# Patient Record
Sex: Male | Born: 1960 | Race: Black or African American | Hispanic: No | Marital: Single | Smoking: Former smoker
Health system: Southern US, Community
[De-identification: ages and names within clinical notes are randomized; demographics above are authoritative.]

## PROBLEM LIST (undated history)

## (undated) DIAGNOSIS — R7611 Nonspecific reaction to tuberculin skin test without active tuberculosis: Secondary | ICD-10-CM

## (undated) DIAGNOSIS — J4520 Mild intermittent asthma, uncomplicated: Secondary | ICD-10-CM

## (undated) DIAGNOSIS — K219 Gastro-esophageal reflux disease without esophagitis: Secondary | ICD-10-CM

## (undated) DIAGNOSIS — T7840XA Allergy, unspecified, initial encounter: Secondary | ICD-10-CM

## (undated) DIAGNOSIS — R972 Elevated prostate specific antigen [PSA]: Secondary | ICD-10-CM

## (undated) DIAGNOSIS — E7800 Pure hypercholesterolemia, unspecified: Secondary | ICD-10-CM

## (undated) HISTORY — DX: Nonspecific reaction to tuberculin skin test without active tuberculosis: R76.11

## (undated) HISTORY — DX: Mild intermittent asthma, uncomplicated: J45.20

## (undated) HISTORY — DX: Elevated prostate specific antigen (PSA): R97.20

## (undated) HISTORY — DX: Allergy, unspecified, initial encounter: T78.40XA

## (undated) HISTORY — PX: NO PAST SURGERIES: SHX2092

---

## 2004-07-08 ENCOUNTER — Ambulatory Visit (HOSPITAL_COMMUNITY): Admission: RE | Admit: 2004-07-08 | Discharge: 2004-07-08 | Payer: Self-pay | Admitting: *Deleted

## 2005-04-26 ENCOUNTER — Encounter: Admission: RE | Admit: 2005-04-26 | Discharge: 2005-04-26 | Payer: Self-pay | Admitting: Occupational Medicine

## 2010-01-17 ENCOUNTER — Emergency Department (HOSPITAL_COMMUNITY): Admission: EM | Admit: 2010-01-17 | Discharge: 2010-01-18 | Payer: Self-pay | Admitting: Emergency Medicine

## 2010-01-20 ENCOUNTER — Ambulatory Visit: Payer: Self-pay | Admitting: Family Medicine

## 2010-01-20 DIAGNOSIS — E669 Obesity, unspecified: Secondary | ICD-10-CM | POA: Insufficient documentation

## 2010-01-20 DIAGNOSIS — J209 Acute bronchitis, unspecified: Secondary | ICD-10-CM | POA: Insufficient documentation

## 2010-01-20 DIAGNOSIS — J9801 Acute bronchospasm: Secondary | ICD-10-CM

## 2010-01-20 DIAGNOSIS — J309 Allergic rhinitis, unspecified: Secondary | ICD-10-CM | POA: Insufficient documentation

## 2010-01-20 DIAGNOSIS — F17200 Nicotine dependence, unspecified, uncomplicated: Secondary | ICD-10-CM | POA: Insufficient documentation

## 2010-01-20 DIAGNOSIS — R5383 Other fatigue: Secondary | ICD-10-CM

## 2010-01-20 DIAGNOSIS — R05 Cough: Secondary | ICD-10-CM

## 2010-01-20 DIAGNOSIS — R5381 Other malaise: Secondary | ICD-10-CM | POA: Insufficient documentation

## 2010-02-03 ENCOUNTER — Encounter: Payer: Self-pay | Admitting: Physician Assistant

## 2010-02-07 LAB — CONVERTED CEMR LAB
ALT: 19 units/L (ref 0–53)
AST: 17 units/L (ref 0–37)
Albumin: 4.1 g/dL (ref 3.5–5.2)
Alkaline Phosphatase: 102 units/L (ref 39–117)
BUN: 13 mg/dL (ref 6–23)
CO2: 25 meq/L (ref 19–32)
Calcium: 9.5 mg/dL (ref 8.4–10.5)
Chloride: 103 meq/L (ref 96–112)
Cholesterol: 217 mg/dL — ABNORMAL HIGH (ref 0–200)
Creatinine, Ser: 1.01 mg/dL (ref 0.40–1.50)
Glucose, Bld: 94 mg/dL (ref 70–99)
HCT: 50.2 % (ref 39.0–52.0)
HDL: 42 mg/dL (ref >39)
Hemoglobin: 16 g/dL (ref 13.0–17.0)
LDL Cholesterol: 133 mg/dL — ABNORMAL HIGH (ref 0–99)
MCHC: 31.9 g/dL (ref 30.0–36.0)
MCV: 94 fL (ref 78.0–100.0)
PSA: 0.71 ng/mL (ref 0.10–4.00)
Platelets: 195 10*3/uL (ref 150–400)
Potassium: 4.1 meq/L (ref 3.5–5.3)
RBC: 5.34 M/uL (ref 4.22–5.81)
RDW: 14 % (ref 11.5–15.5)
Sodium: 139 meq/L (ref 135–145)
TSH: 0.762 microintl units/mL (ref 0.350–4.500)
Total Bilirubin: 1.3 mg/dL — ABNORMAL HIGH (ref 0.3–1.2)
Total CHOL/HDL Ratio: 5.2
Total Protein: 7.3 g/dL (ref 6.0–8.3)
Triglycerides: 211 mg/dL — ABNORMAL HIGH (ref <150)
VLDL: 42 mg/dL — ABNORMAL HIGH (ref 0–40)
Vit D, 25-Hydroxy: 15 ng/mL — ABNORMAL LOW (ref 30–89)
WBC: 6 10*3/uL (ref 4.0–10.5)

## 2010-02-08 ENCOUNTER — Ambulatory Visit: Payer: Self-pay | Admitting: Family Medicine

## 2010-02-08 DIAGNOSIS — E559 Vitamin D deficiency, unspecified: Secondary | ICD-10-CM | POA: Insufficient documentation

## 2010-02-08 DIAGNOSIS — E785 Hyperlipidemia, unspecified: Secondary | ICD-10-CM | POA: Insufficient documentation

## 2010-02-08 DIAGNOSIS — K219 Gastro-esophageal reflux disease without esophagitis: Secondary | ICD-10-CM

## 2010-02-08 LAB — CONVERTED CEMR LAB: OCCULT 1: NEGATIVE

## 2010-04-11 ENCOUNTER — Telehealth: Payer: Self-pay | Admitting: Physician Assistant

## 2010-04-18 ENCOUNTER — Ambulatory Visit: Payer: Self-pay | Admitting: Family Medicine

## 2010-05-11 ENCOUNTER — Telehealth: Payer: Self-pay | Admitting: Physician Assistant

## 2010-05-12 ENCOUNTER — Ambulatory Visit: Payer: Self-pay | Admitting: Family Medicine

## 2010-05-12 DIAGNOSIS — J019 Acute sinusitis, unspecified: Secondary | ICD-10-CM

## 2010-06-08 LAB — CONVERTED CEMR LAB
Cholesterol: 224 mg/dL — ABNORMAL HIGH (ref 0–200)
HDL: 34 mg/dL — ABNORMAL LOW (ref >39)
LDL Cholesterol: 164 mg/dL — ABNORMAL HIGH (ref 0–99)
Total CHOL/HDL Ratio: 6.6
Triglycerides: 131 mg/dL (ref <150)
VLDL: 26 mg/dL (ref 0–40)
Vit D, 25-Hydroxy: 29 ng/mL — ABNORMAL LOW (ref 30–89)

## 2010-06-09 ENCOUNTER — Ambulatory Visit: Payer: Self-pay | Admitting: Family Medicine

## 2010-06-09 ENCOUNTER — Telehealth: Payer: Self-pay | Admitting: Physician Assistant

## 2010-06-10 ENCOUNTER — Telehealth: Payer: Self-pay | Admitting: Physician Assistant

## 2010-06-22 ENCOUNTER — Ambulatory Visit (HOSPITAL_COMMUNITY): Admission: RE | Admit: 2010-06-22 | Discharge: 2010-06-22 | Payer: Self-pay | Admitting: Family Medicine

## 2010-06-22 ENCOUNTER — Encounter: Payer: Self-pay | Admitting: Physician Assistant

## 2010-07-11 ENCOUNTER — Ambulatory Visit: Payer: Self-pay | Admitting: Family Medicine

## 2010-07-11 DIAGNOSIS — J449 Chronic obstructive pulmonary disease, unspecified: Secondary | ICD-10-CM

## 2010-07-11 DIAGNOSIS — J4489 Other specified chronic obstructive pulmonary disease: Secondary | ICD-10-CM | POA: Insufficient documentation

## 2010-07-14 ENCOUNTER — Encounter: Payer: Self-pay | Admitting: Family Medicine

## 2010-09-02 LAB — CONVERTED CEMR LAB
ALT: 21 units/L (ref 0–53)
AST: 18 units/L (ref 0–37)
Albumin: 4.2 g/dL (ref 3.5–5.2)
Alkaline Phosphatase: 80 units/L (ref 39–117)
BUN: 12 mg/dL (ref 6–23)
CO2: 26 meq/L (ref 19–32)
Calcium: 9.1 mg/dL (ref 8.4–10.5)
Chloride: 103 meq/L (ref 96–112)
Cholesterol: 204 mg/dL — ABNORMAL HIGH (ref 0–200)
Creatinine, Ser: 1.02 mg/dL (ref 0.40–1.50)
Glucose, Bld: 97 mg/dL (ref 70–99)
HDL: 36 mg/dL — ABNORMAL LOW (ref >39)
LDL Cholesterol: 137 mg/dL — ABNORMAL HIGH (ref 0–99)
Potassium: 4.1 meq/L (ref 3.5–5.3)
Sodium: 139 meq/L (ref 135–145)
Total Bilirubin: 1 mg/dL (ref 0.3–1.2)
Total CHOL/HDL Ratio: 5.7
Total Protein: 7.8 g/dL (ref 6.0–8.3)
Triglycerides: 155 mg/dL — ABNORMAL HIGH (ref <150)
VLDL: 31 mg/dL (ref 0–40)

## 2010-09-06 ENCOUNTER — Ambulatory Visit: Payer: Self-pay | Admitting: Family Medicine

## 2010-10-03 ENCOUNTER — Emergency Department (HOSPITAL_BASED_OUTPATIENT_CLINIC_OR_DEPARTMENT_OTHER)
Admission: EM | Admit: 2010-10-03 | Discharge: 2010-10-03 | Payer: Self-pay | Source: Home / Self Care | Admitting: Emergency Medicine

## 2010-10-03 ENCOUNTER — Encounter: Payer: Self-pay | Admitting: Family Medicine

## 2010-10-03 ENCOUNTER — Telehealth: Payer: Self-pay | Admitting: Family Medicine

## 2010-10-04 ENCOUNTER — Ambulatory Visit
Admission: RE | Admit: 2010-10-04 | Discharge: 2010-10-04 | Payer: Self-pay | Source: Home / Self Care | Attending: Family Medicine | Admitting: Family Medicine

## 2010-10-05 ENCOUNTER — Encounter: Payer: Self-pay | Admitting: Family Medicine

## 2010-10-10 ENCOUNTER — Ambulatory Visit
Admission: RE | Admit: 2010-10-10 | Discharge: 2010-10-10 | Payer: Self-pay | Source: Home / Self Care | Attending: Internal Medicine | Admitting: Internal Medicine

## 2010-10-25 NOTE — Assessment & Plan Note (Signed)
Summary: OV   Vital Signs:  Patient profile:   50 year old male Height:      72 inches Weight:      236.75 pounds O2 Sat:      95 % on Room air Pulse rate:   100 / minute Resp:     16 per minute BP sitting:   110 / 80  (left arm)  Vitals Entered By: Mauricia Area CMA (July 11, 2010 3:39 PM) CC: congestion. Producing yellow phlegm   Primary Provider:  Esperanza Sheets PA  CC:  congestion. Producing yellow phlegm.  History of Present Illness: Pt continues to have episodic cough and congestion.  Returned again approx 3 days ago.  Cough is sometimes productive with yellowish phlegm.  Is using Advair two times a day.  Also has nasal congestion, sneezing and some post nasal drainage.  No fever or chills.  Pt has been cutting back on smoking.  Wants to quit. Reviewed recent PFT results.    Allergies (verified): 1)  ! * Pcn  Past History:  Past medical history reviewed for relevance to current acute and chronic problems, and updated.  Past Medical History: Hyperlipidemia Vitamin D Deficiency COPD  Review of Systems General:  Denies chills and fever. ENT:  Complains of nasal congestion and postnasal drainage; denies earache, sinus pressure, and sore throat. CV:  Denies chest pain or discomfort and palpitations. Resp:  Complains of cough and sputum productive; denies wheezing. Allergy:  Complains of seasonal allergies and sneezing.  Physical Exam  General:  Well-developed,well-nourished,in no acute distress; alert,appropriate and cooperative throughout examination Head:  Normocephalic and atraumatic without obvious abnormalities. No apparent alopecia or balding. Ears:  External ear exam shows no significant lesions or deformities.  Otoscopic examination reveals clear canals, tympanic membranes are intact bilaterally without bulging, retraction, inflammation or discharge. Hearing is grossly normal bilaterally. Nose:  External nasal examination shows no deformity or  inflammation. Nasal mucosa are pink and moist without lesions or exudates. Mouth:  Oral mucosa and oropharynx without lesions or exudates.  Teeth in good repair. Neck:  No deformities, masses, or tenderness noted. Lungs:  Normal respiratory effort, chest expands symmetrically. Lungs are clear to auscultation, no crackles or wheezes. Heart:  Normal rate and regular rhythm. S1 and S2 normal without gallop, murmur, click, rub or other extra sounds. Cervical Nodes:  No lymphadenopathy noted Psych:  Cognition and judgment appear intact. Alert and cooperative with normal attention span and concentration. No apparent delusions, illusions, hallucinations   Impression & Recommendations:  Problem # 1:  ALLERGIC RHINITIS (ICD-477.9) Assessment Comment Only  Orders: Allergy Referral  (Allergy)  Problem # 2:  COPD, MILD (ICD-496) Assessment: Comment Only  His updated medication list for this problem includes:    Ventolin Hfa 108 (90 Base) Mcg/act Aers (Albuterol sulfate) .Marland Kitchen... 1 puff two times a day    Advair Diskus 250-50 Mcg/dose Aepb (Fluticasone-salmeterol) ..... One inhalation two times a day.  Complete Medication List: 1)  Ventolin Hfa 108 (90 Base) Mcg/act Aers (Albuterol sulfate) .Marland Kitchen.. 1 puff two times a day 2)  Advair Diskus 250-50 Mcg/dose Aepb (Fluticasone-salmeterol) .... One inhalation two times a day. 3)  Vitamin D 1000 Unit Tabs (Cholecalciferol) .... Take 1 daily 4)  Omeprazole 20 Mg Cpdr (Omeprazole) .... Take 1 daily 5)  Tessalon Perles 100 Mg Caps (Benzonatate) .... Take 1 every 8 hrs as needed for cough 6)  Aspirin 81 Mg Tbec (Aspirin) .... Take 1 daily 7)  Fish Oil 1000 Mg Caps (  Omega-3 fatty acids) .... Take 1 daily 8)  Pravastatin Sodium 20 Mg Tabs (Pravastatin sodium) .... Take 1 tab daily at bedtime 9)  Zyrtec-d Allergy & Congestion 5-120 Mg Xr12h-tab (Cetirizine-pseudoephedrine) .... One two times a day 10)  Levaquin 750 Mg Tabs (Levofloxacin) .... Take one daily x 7  days  Patient Instructions: 1)  Follow up as planned. 2)  I have prescribed an antibiotic for you. 3)  I have referred you to an allergist for evaluation. 4)  Continue using your inhalers as prescribed. 5)  Tobacco is very bad for your health and your loved ones! You Should stop smoking!. 6)  Stop Smoking Tips: Choose a Quit date. Cut down before the Quit date. decide what you will do as a substitute when you feel the urge to smoke(gum,toothpick,exercise). 7)  Your insurance does not cover prescription products for smoking cessation.  I recommend you try an over the counter product such as the nicotine 7 mg patch. Prescriptions: LEVAQUIN 750 MG TABS (LEVOFLOXACIN) take one daily x 7 days  #7 x 0   Entered and Authorized by:   Esperanza Sheets PA   Signed by:   Esperanza Sheets PA on 07/11/2010   Method used:   [facility]        [facility] [phone][facility] (retail)       [address], [place]  [address]       Ph: [phone] or [phone]       Fax: [phone]   RxID:   [id number]    Orders Added: 1)  Allergy Referral  [Allergy] 2)  Est. Patient Level III [34742]

## 2010-10-25 NOTE — Assessment & Plan Note (Signed)
Summary: physical- room 2   Vital Signs:  Patient profile:   50 year old male Height:      72 inches Weight:      236 pounds BMI:     32.12 O2 Sat:      98 % on Room air Pulse rate:   119 / minute Resp:     16 per minute BP sitting:   130 / 80  (left arm)  Vitals Entered By: Adella Hare LPN (Feb 08, 2010 4:05 PM) CC: physical Is Patient Diabetic? No Pain Assessment Patient in pain? no      Comments did not bring meds to ov   Primary Provider:  Esperanza Sheets PA  CC:  physical.  History of Present Illness: Pt is here today for a physcial.  No current complaints or concerns.  He was recently treated for an asthmatic bronchitis.  States his breathing is much better.  cough has resolved.  Did use albuterol mdi twice last week due to wheeze but none this week.  He is still using Advair two times a day.  Just picked up a refill on this.  Last tetnus 02-04-10 (Td) No prev eye exam - has vision screening at work yrly. Hearing& breathing test at work yrly - tomorrow Dental care twice a yr.  Again discussed recent lab test results.    Allergies (verified): 1)  ! * Pcn  Past History:  Past medical, surgical, family and social histories (including risk factors) reviewed, and no changes noted (except as noted below).  Past Medical History: Hyperlipidemia Vitamin D Deficiency  Past Surgical History: Reviewed history from 01/20/2010 and no changes required. Denies surgical history  Family History: Reviewed history from 01/20/2010 and no changes required. mother-living-htn father-deceased-diabetic 3 sisters-healthy 2 brothers-healthy  Social History: Reviewed history from 01/20/2010 and no changes required. 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 Eyes:  Denies blurring and double vision. ENT:  Denies decreased  hearing, difficulty swallowing, ear discharge, earache, nasal congestion, postnasal drainage, and sore throat. CV:  Complains of chest pain or discomfort; denies palpitations; Lt rib tender to touch or if lies on Lt side. Resp:  Complains of wheezing; denies cough and shortness of breath. GI:  Complains of indigestion; denies abdominal pain, bloody stools, change in bowel habits, constipation, dark tarry stools, diarrhea, nausea, and vomiting. GU:  Denies decreased libido, discharge, dysuria, erectile dysfunction, incontinence, nocturia, and urinary frequency. MS:  Denies joint pain, low back pain, and mid back pain. Derm:  Denies lesion(s) and rash. Neuro:  Denies headaches, numbness, and tingling. Allergy:  Complains of seasonal allergies and sneezing.  Physical Exam  General:  Well-developed,well-nourished,in no acute distress; alert,appropriate and cooperative throughout examination Head:  Normocephalic and atraumatic without obvious abnormalities. No apparent alopecia or balding. Eyes:  No corneal or conjunctival inflammation noted. EOMI. Perrla. Funduscopic exam benign, without hemorrhages, exudates or papilledema. Vision grossly normal. Ears:  External ear exam shows no significant lesions or deformities.  Otoscopic examination reveals clear canals, tympanic membranes are intact bilaterally without bulging, retraction, inflammation or discharge. Hearing is grossly normal bilaterally. Nose:  External nasal examination shows no deformity or inflammation. Nasal mucosa are pink and moist without lesions or exudates. Mouth:  Oral mucosa and oropharynx without lesions or exudates.  Teeth in good repair. Neck:  No deformities, masses, or tenderness noted.no thyromegaly and no thyroid nodules or tenderness.  Chest Wall:  no deformities and no masses.   Lungs:  Normal respiratory effort, chest expands symmetrically. Lungs are clear to auscultation, no crackles or wheezes. Heart:  Normal rate and  regular rhythm. S1 and S2 normal without gallop, murmur, click, rub or other extra sounds. Abdomen:  Bowel sounds positive,abdomen soft and non-tender without masses, organomegaly or hernias noted. Rectal:  No external abnormalities noted. Normal sphincter tone. No rectal masses or tenderness. Genitalia:  Testes bilaterally descended without nodularity, tenderness or masses. No scrotal masses or lesions. No penis lesions or urethral discharge. Pulses:  R posterior tibial normal, R dorsalis pedis normal, L posterior tibial normal, and L dorsalis pedis normal.   Extremities:  No clubbing, cyanosis, edema, or deformity noted with normal full range of motion of all joints.   Neurologic:  alert & oriented X3, cranial nerves II-XII intact, strength normal in all extremities, sensation intact to light touch, gait normal, and DTRs symmetrical and normal.   Skin:  Intact without suspicious lesions or rashes Cervical Nodes:  No lymphadenopathy noted Psych:  Cognition and judgment appear intact. Alert and cooperative with normal attention span and concentration. No apparent delusions, illusions, hallucinations   Impression & Recommendations:  Problem # 1:  Preventive Health Care (ICD-V70.0) Assessment Comment Only Encouraged routine optometry exam. Pt receives vaccines thru employer, including flu.  Problem # 2:  ESOPHAGEAL REFLUX (ICD-530.81) Assessment: New  His updated medication list for this problem includes:    Omeprazole 20 Mg Cpdr (Omeprazole) .Marland Kitchen... Take 1 daily  Orders: Hemoccult Guaiac-1 spec.(in office) (82270)  Problem # 3:  HYPERLIPIDEMIA (ICD-272.4) Assessment: Comment Only  Orders: T-Lipid Profile [id number])  Labs Reviewed: SGOT: 17 (02/03/2010)   SGPT: 19 (02/03/2010)   HDL:42 (02/03/2010)  LDL:133 (02/03/2010)  Chol:217 (02/03/2010)  Trig:211 (02/03/2010)  Problem # 4:  VITAMIN D DEFICIENCY (ICD-268.9) Assessment: Comment Only  Orders: T-Vitamin D (25-Hydroxy)  ([id number])  Problem # 5:  ACUTE BRONCHITIS (ICD-466.0) Assessment: Improved Pt to will continue Advair x approx 2 more weeks then discontinue. He is having a pulmonary function test at work tomorrow.  Will notify the office if abn.   The following medications were removed from the medication list:    Allegra-d 24 Hour 180-240 Mg Xr24h-tab (Fexofenadine-pseudoephedrine) .Marland Kitchen... Take 1 daily    Tessalon Perles 100 Mg Caps (Benzonatate) .Marland Kitchen... Take 1-2 every 8 hrs as needed for cough His updated medication list for this problem includes:    Ventolin Hfa 108 (90 Base) Mcg/act Aers (Albuterol sulfate) .Marland Kitchen... 1 puff two times a day    Advair Diskus 250-50 Mcg/dose Aepb (Fluticasone-salmeterol) ..... One inhalation bid  Complete Medication List: 1)  Ventolin Hfa 108 (90 Base) Mcg/act Aers (Albuterol sulfate) .Marland Kitchen.. 1 puff two times a day 2)  Advair Diskus 250-50 Mcg/dose Aepb (Fluticasone-salmeterol) .... One inhalation bid 3)  Vitamin D (ergocalciferol) 50000 Unit Caps (Ergocalciferol) .... One cap by mouth every week 4)  Omeprazole 20 Mg Cpdr (Omeprazole) .... Take 1 daily  Preventive Care Screening  Hemoccult:    Date:  02/08/2010    Results:  negative  Last Tetanus Booster:    Date:  02/04/2010    Results:  Historical    Patient Instructions: 1)  Please schedule a follow-up appointment in 4 months. 2)  It is important that you exercise regularly at least 20 minutes 5 times a week. If you develop chest pain, have severe difficulty breathing, or feel very tired , stop exercising immediately and seek medical attention. 3)  You  need to lose weight. Consider a lower calorie diet and regular exercise.  4)  I have ordered your blood work to have drawn fasting in 3 months. 5)  Follow a low fat diet to improve your cholesterol levels. Prescriptions: OMEPRAZOLE 20 MG CPDR (OMEPRAZOLE) take 1 daily  #30 x 0   Entered and Authorized by:   Esperanza Sheets PA   Signed by:   Esperanza Sheets PA on  02/08/2010   Method used:   [facility]        [facility] [phone]* (retail)       [address], [place]  [address]       Ph: [phone] or [phone]       Fax: [phone]   RxID:   [id number]    Immunization History:  Tetanus/Td Immunization History:    Tetanus/Td:  td (02/04/2010)    Tetanus/Td:  historical (02/04/2010)  Laboratory Results    Stool - Occult Blood Hemmoccult #1: negative Date: 02/08/2010    Preventive Care Screening  Hemoccult:    Date:  02/08/2010    Results:  negative  Last Tetanus Booster:    Date:  02/04/2010    Results:  Historical    Prevention & Chronic Care Immunizations   Influenza vaccine: Not documented   Influenza vaccine due: 05/26/2010    Tetanus booster: 02/04/2010: Td   Tetanus booster due: 02/05/2020    Pneumococcal vaccine: Not documented  Other Screening   PSA: 0.71  (02/03/2010)   Smoking status: current  (01/20/2010)   Smoking cessation counseling: yes  (01/20/2010)  Lipids   Total Cholesterol: 217  (02/03/2010)   LDL: 133  (02/03/2010)   LDL Direct: Not documented   HDL: 42  (02/03/2010)   Triglycerides: 211  (02/03/2010)    SGOT (AST): 17  (02/03/2010)   SGPT (ALT): 19  (02/03/2010)   Alkaline phosphatase: 102  (02/03/2010)   Total bilirubin: 1.3  (02/03/2010)  Self-Management Support :    Lipid self-management support: Not documented    Appended Document: physical- room 2 Prostate exam neg.  Firm, smooth without mass/nodule.  Nontender.

## 2010-10-25 NOTE — Progress Notes (Signed)
  Phone Note Call from Patient   Summary of Call: Patient called in wanting to see Timothy Chung and was in severe pain from what he thought was a pinched nerve. I advised urgent care or ER Initial call taken by: Everitt Amber LPN,  April 11, 2010 8:18 AM

## 2010-10-25 NOTE — Assessment & Plan Note (Signed)
Summary: office visit ROOM 1   Vital Signs:  Patient profile:   50 year old male Height:      72 inches Weight:      235 pounds BMI:     31.99 O2 Sat:      97 % Pulse rate:   106 / minute Resp:     16 per minute BP sitting:   120 / 82  (left arm) Cuff size:   large  Vitals Entered By: Everitt Amber LPN (June 09, 2010 10:33 AM)  Nutrition Counseling: Patient's BMI is greater than 25 and therefore counseled on weight management options. CC: Follow up visit  Comments didn't bring meds   Primary Provider:  Esperanza Sheets PA  CC:  Follow up visit .  History of Present Illness: Pt presents today for check up and discuss recent labs.  He states he is feeling better.  Cough has improved and not wheezing. He is still using Advair two times a day.  He is using over the counter Zytec for allergies.  States he went fishing a couple days ago and afterwards had sneezing, increased nasal congestion and cough.  Pt has improved diet and taking fish oil caps for cholesterol.  No known FH of Hyperlipidemia or CAD.  Hx of GERD.  Doing well on Omeprazole.  No current indigestion problems.    Current Medications (verified): 1)  Ventolin Hfa 108 (90 Base) Mcg/act Aers (Albuterol Sulfate) .Marland Kitchen.. 1 Puff Two Times A Day 2)  Advair Diskus 250-50 Mcg/dose Aepb (Fluticasone-Salmeterol) .... One Inhalation Bid 3)  Vitamin D (Ergocalciferol) 50000 Unit Caps (Ergocalciferol) .... One Cap By Mouth Every Week 4)  Omeprazole 20 Mg Cpdr (Omeprazole) .... Take 1 Daily 5)  Tessalon Perles 100 Mg Caps (Benzonatate) .... Take 1 Every 8 Hrs As Needed For Cough  Allergies (verified): 1)  ! * Pcn  Past History:  Past medical, surgical, family and social histories (including risk factors) reviewed for relevance to current acute and chronic problems.  Past Medical History: Reviewed history from 02/08/2010 and no changes required. Hyperlipidemia Vitamin D Deficiency  Past Surgical History: Reviewed history  from 01/20/2010 and no changes required. Denies surgical history  Family History: Reviewed history from 01/20/2010 and no changes required. mother-living-htn father-deceased-diabetic 3 sisters-healthy 2 brothers-healthy  Social History: Reviewed history from 01/20/2010 and no changes required. 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 ENT:  Complains of nasal congestion; denies earache, sinus pressure, and sore throat. CV:  Denies chest pain or discomfort. Resp:  Denies cough, shortness of breath, and wheezing.  Physical Exam  General:  Well-developed,well-nourished,in no acute distress; alert,appropriate and cooperative throughout examination Head:  Normocephalic and atraumatic without obvious abnormalities. No apparent alopecia or balding. Ears:  External ear exam shows no significant lesions or deformities.  Otoscopic examination reveals clear canals, tympanic membranes are intact bilaterally without bulging, retraction, inflammation or discharge. Hearing is grossly normal bilaterally. Nose:  no external deformity.  Nasal turbs mod swollen with clear mucus Mouth:  Oral mucosa and oropharynx without lesions or exudates.  Teeth in good repair. Neck:  No deformities, masses, or tenderness noted. Lungs:  Normal respiratory effort, chest expands symmetrically. Lungs are clear to auscultation, no crackles or wheezes. Heart:  Normal rate and regular rhythm. S1 and S2 normal without gallop, murmur, click, rub or other extra sounds. Cervical Nodes:  No lymphadenopathy noted Psych:  Cognition and judgment  appear intact. Alert and cooperative with normal attention span and concentration. No apparent delusions, illusions, hallucinations   Impression & Recommendations:  Problem # 1:  HYPERLIPIDEMIA (ICD-272.4) Assessment Deteriorated  Chol diet handout  given.  His updated medication list for this problem includes:    Crestor 5 Mg Tabs (Rosuvastatin calcium) .Marland Kitchen... Take 1 daily for cholesterol  Orders: T-Comprehensive Metabolic Panel [id number]) T-Lipid Profile ([id number])  Problem # 2:  ALLERGIC RHINITIS (ICD-477.9) Assessment: Comment Only Continue Zyrtec.  Problem # 3:  ESOPHAGEAL REFLUX (ICD-530.81) Assessment: Improved  His updated medication list for this problem includes:    Omeprazole 20 Mg Cpdr (Omeprazole) .Marland Kitchen... Take 1 daily  Problem # 4:  ACUTE BRONCHOSPASM (ICD-519.11) Assessment: Comment Only  Orders: PFT Baseline-Pre/Post Bronchodiolator (PFT Baseline-Pre/Pos)  Complete Medication List: 1)  Ventolin Hfa 108 (90 Base) Mcg/act Aers (Albuterol sulfate) .Marland Kitchen.. 1 puff two times a day 2)  Advair Diskus 250-50 Mcg/dose Aepb (Fluticasone-salmeterol) .... One inhalation bid 3)  Vitamin D 1000 Unit Tabs (Cholecalciferol) .... Take 1 daily 4)  Omeprazole 20 Mg Cpdr (Omeprazole) .... Take 1 daily 5)  Tessalon Perles 100 Mg Caps (Benzonatate) .... Take 1 every 8 hrs as needed for cough 6)  Aspirin 81 Mg Tbec (Aspirin) .... Take 1 daily 7)  Fish Oil 1000 Mg Caps (Omega-3 fatty acids) .... Take 1 daily 8)  Crestor 5 Mg Tabs (Rosuvastatin calcium) .... Take 1 daily for cholesterol  Patient Instructions: 1)  Please schedule a follow-up appointment in 3 months. 2)  It is important that you exercise regularly at least 20 minutes 5 times a week. If you develop chest pain, have severe difficulty breathing, or feel very tired , stop exercising immediately and seek medical attention. 3)  You need to lose weight. Consider a lower calorie diet and regular exercise.  4)  Start Crestor for cholesterol. 5)  I have ordered a breathing test for you. Prescriptions: CRESTOR 5 MG TABS (ROSUVASTATIN CALCIUM) take 1 daily for cholesterol  #30 x 3   Entered and Authorized by:   Esperanza Sheets PA   Signed by:   Esperanza Sheets PA on 06/09/2010    Method used:   [facility]        [facility]. [phone]* (retail)       [address], [place]  [address]       Ph: [phone] or [phone]       Fax: [phone]   RxID:   [id number] ADVAIR DISKUS 250-50 MCG/DOSE AEPB (FLUTICASONE-SALMETEROL) one inhalation bid  #1 x 2   Entered and Authorized by:   Esperanza Sheets PA   Signed by:   Esperanza Sheets PA on 06/09/2010   Method used:   [facility] to        [facility]. [phone]* (retail)       [address] [place]  [address]       Ph: [phone] or [phone]       Fax: [phone]   RxID:   [id number] VENTOLIN HFA 108 (90 BASE) MCG/ACT AERS (ALBUTEROL SULFATE) 1 puff two times a day  #1 x 2   Entered and Authorized by:   Esperanza Sheets PA   Signed by:   Esperanza Sheets PA on 06/09/2010   Method used:   [facility] to        [facility]. [phone]* (retail)  [address]       [place], [place]  [address]       Ph: [phone] or [phone]       Fax: [phone]   RxID:   [id number]

## 2010-10-25 NOTE — Assessment & Plan Note (Signed)
Summary: ov Room 1   Vital Signs:  Patient profile:   50 year old male Height:      72 inches Weight:      233 pounds BMI:     31.71 O2 Sat:      95 % Pulse rate:   110 / minute Resp:     16 per minute BP sitting:   120 / 80  (left arm) Cuff size:   large  Vitals Entered By: Everitt Amber LPN CC: was here last time for congestion and the meds given didn't work. Still coughing up yellowish phlegm and nasal congestion   Primary Provider:  Esperanza Sheets PA  CC:  was here last time for congestion and the meds given didn't work. Still coughing up yellowish phlegm and nasal congestion.  History of Present Illness: Pt presents today with complaints of continued congestion and cough. States he is coughing up and blowing thick yellow mucus from his nose.  Is wheezing at night.  No fever or chills. Is having some Lt ear discomfort too.  No improvement at all with last antibiotic prescription.  Using Adviar two times a day.  Using albuterol once a night avg when awakens with wheezing.  Denies difficulty breathing or wheezing during the daytime.  He has run out of Tessalon, and this was helping with his cough.      Current Medications (verified): 1)  Ventolin Hfa 108 (90 Base) Mcg/act Aers (Albuterol Sulfate) .Marland Kitchen.. 1 Puff Two Times A Day 2)  Advair Diskus 250-50 Mcg/dose Aepb (Fluticasone-Salmeterol) .... One Inhalation Bid 3)  Vitamin D (Ergocalciferol) 50000 Unit Caps (Ergocalciferol) .... One Cap By Mouth Every Week 4)  Omeprazole 20 Mg Cpdr (Omeprazole) .... Take 1 Daily  Allergies (verified): 1)  ! * Pcn  Past History:  Past medical history reviewed for relevance to current acute and chronic problems.  Past Medical History: Reviewed history from 02/08/2010 and no changes required. Hyperlipidemia Vitamin D Deficiency  Review of Systems General:  Denies chills and fever. ENT:  Complains of earache, nasal congestion, and postnasal drainage; denies sinus pressure and sore  throat. CV:  Denies chest pain or discomfort. Resp:  Complains of cough, sputum productive, and wheezing; denies shortness of breath. Allergy:  Denies itching eyes and sneezing.  Physical Exam  General:  Well-developed,well-nourished,in no acute distress; alert,appropriate and cooperative throughout examination Head:  Normocephalic and atraumatic without obvious abnormalities. No apparent alopecia or balding. Ears:  External ear exam shows no significant lesions or deformities.  Otoscopic examination reveals clear canals, tympanic membranes are intact bilaterally without bulging, retraction, inflammation or discharge. Hearing is grossly normal bilaterally. Nose:  no external deformity.  Mild edema of nasal turbs with erythema and yellowish mucus bilat Mouth:  Oral mucosa and oropharynx without lesions or exudates.  Teeth in good repair. Neck:  No deformities, masses, or tenderness noted. Lungs:  normal respiratory effort, no intercostal retractions, and no accessory muscle use.  Mild rhonchi bilat bases, no rales or wheeze. Heart:  Normal rate and regular rhythm. S1 and S2 normal without gallop, murmur, click, rub or other extra sounds. Cervical Nodes:  No lymphadenopathy noted Psych:  Cognition and judgment appear intact. Alert and cooperative with normal attention span and concentration. No apparent delusions, illusions, hallucinations   Impression & Recommendations:  Problem # 1:  ACUTE BRONCHITIS (ICD-466.0) Assessment Unchanged Oredered CXR, but then pt stated that he had one in April at Urgent care when he had same syptoms. Consider ordering PFT at  f/u appt.  His updated medication list for this problem includes:    Ventolin Hfa 108 (90 Base) Mcg/act Aers (Albuterol sulfate) .Marland Kitchen... 1 puff two times a day    Advair Diskus 250-50 Mcg/dose Aepb (Fluticasone-salmeterol) ..... One inhalation bid    Avelox 400 Mg Tabs (Moxifloxacin hcl) .Marland Kitchen... Take 1 daily x 7 days    Tessalon Perles 100  Mg Caps (Benzonatate) .Marland Kitchen... Take 1 every 8 hrs as needed for cough  Problem # 2:  SINUSITIS, ACUTE (ICD-461.9) Assessment: New  His updated medication list for this problem includes:    Avelox 400 Mg Tabs (Moxifloxacin hcl) .Marland Kitchen... Take 1 daily x 7 days    Tessalon Perles 100 Mg Caps (Benzonatate) .Marland Kitchen... Take 1 every 8 hrs as needed for cough  Complete Medication List: 1)  Ventolin Hfa 108 (90 Base) Mcg/act Aers (Albuterol sulfate) .Marland Kitchen.. 1 puff two times a day 2)  Advair Diskus 250-50 Mcg/dose Aepb (Fluticasone-salmeterol) .... One inhalation bid 3)  Vitamin D (ergocalciferol) 50000 Unit Caps (Ergocalciferol) .... One cap by mouth every week 4)  Omeprazole 20 Mg Cpdr (Omeprazole) .... Take 1 daily 5)  Prednisone 20 Mg Tabs (Prednisone) .... Take 2 tabs daily x 4 days 6)  Avelox 400 Mg Tabs (Moxifloxacin hcl) .... Take 1 daily x 7 days 7)  Tessalon Perles 100 Mg Caps (Benzonatate) .... Take 1 every 8 hrs as needed for cough  Other Orders: Chest - 2 Views (Chest 2 Views)  Patient Instructions: 1)  Please schedule a follow-up appointment in 2 weeks, sooner if needed. 2)  I have prescribed Prednisone and Avelox for you. 3)  I have refilled your cough medicine also. Prescriptions: TESSALON PERLES 100 MG CAPS (BENZONATATE) take 1 every 8 hrs as needed for cough  #30 x 0   Entered and Authorized by:   Esperanza Sheets PA   Signed by:   Esperanza Sheets PA on 05/12/2010   Method used:   [facility]        [facility] [phone]* (retail)       [address], [place]  [address]       Ph: [phone] or [phone]       Fax: [phone]   RxID:   [id number] AVELOX 400 MG TABS (MOXIFLOXACIN HCL) take 1 daily x 7 days  #7 x 0   Entered and Authorized by:   Esperanza Sheets PA   Signed by:   Esperanza Sheets PA on 05/12/2010   Method used:   [facility] to        [facility] [phone]* (retail)       [address] [place]  [address]       Ph: [phone] or  [phone]       Fax: [phone]   RxID:   [id number] PREDNISONE 20 MG TABS (PREDNISONE) take 2 tabs daily x 4 days  #8 x 0   Entered and Authorized by:   Esperanza Sheets PA   Signed by:   Esperanza Sheets PA on 05/12/2010   Method used:   [facility] to        [facility] [phone]* (retail)       [address]       [place], [place]  [address]       Ph: [phone] or [phone]       Fax: [phone]  RxID:   [id number]

## 2010-10-25 NOTE — Progress Notes (Signed)
Summary: FORGOT TO TEEL YOU  Phone Note Call from Patient   Summary of Call: NEEDS TESSALON PERLES [facility] AT [facility] [address] [facility] [place] Initial call taken by: Lind Guest,  June 09, 2010 10:59 AM    Prescriptions: TESSALON PERLES 100 MG CAPS (BENZONATATE) take 1 every 8 hrs as needed for cough  #30 x 0   Entered and Authorized by:   Esperanza Sheets PA   Signed by:   Esperanza Sheets PA on 06/09/2010   Method used:   [facility] to        [facility] [phone]* (retail)       [address]       [place], [place]  [address]       Ph: [phone] or [phone]       Fax: [phone]   RxID:   [id number]

## 2010-10-25 NOTE — Assessment & Plan Note (Signed)
Summary: new patient ROOM-3   Vital Signs:  Patient profile:   50 year old male Height:      72 inches Weight:      236.25 pounds BMI:     32.16 O2 Sat:      93 % Pulse rate:   112 / minute Resp:     16 per minute BP sitting:   118 / 78  (left arm) Cuff size:   large  Vitals Entered By: Everitt Amber LPN (January 20, 2010 3:21 PM) CC: New Patient   CC:  New Patient.  History of Present Illness: New pt here to establish care with new PCP.  Pt has been to urgent care three times, for head & chest congestion.  Initially prescribed Zithromax, then 2nd time another antibiotic.  3rd time had neg CXR & prescribed Levaquin 750 mg, completed 6 days ago. Urgent care also prescribed Advair 250/50 and albuterol inhaler. Seen at ER Monday with wheezing.  Gave him albuterol treatments. Rx'd prednisone.  Current Medications (verified): 1)  Ventolin Hfa 108 (90 Base) Mcg/act Aers (Albuterol Sulfate) .Marland Kitchen.. 1 Puff Two Times A Day  Allergies (verified): 1)  ! * Pcn  Past History:  Past medical, surgical, family and social histories (including risk factors) reviewed for relevance to current acute and chronic problems.  Past Surgical History: Denies surgical history  Family History: Reviewed history and no changes required. mother-living-htn father-deceased-diabetic 3 sisters-healthy 2 brothers-healthy  Social History: Reviewed history and no changes required. Occupation: Secondary school teacher a day Alcohol use-yes-occasional Regular exercise-no Occupation:  employed Smoking Status:  current Does Patient Exercise:  no   Complete Medication List: 1)  Ventolin Hfa 108 (90 Base) Mcg/act Aers (Albuterol sulfate) .Marland Kitchen.. 1 puff two times a day   Appended Document: new patient ROOM-3     Primary Provider:  Esperanza Sheets PA   History of Present Illness: New pt here to establish care with new PCP.  Pt has been to urgent care three times, for head & chest  congestion.  Initially prescribed Zithromax, then 2nd time another antibiotic.  3rd time had neg CXR & prescribed Levaquin 750 mg, completed 6 days ago. Urgent care also prescribed Advair 250/50 and albuterol inhaler. Seen at ER Monday with wheezing.  Gave him albuterol treatments. Rx'd prednisone which does seem to be helping some.  Not sleeping well due to cough.  Ribs now hurting from the cough. sinus congestion has improved. No fever or chills.  Has cut back smoking from 6-7 cigs per day to 2 per day.    Allergies: 1)  ! * Pcn  Past History:  Past medical, surgical, family and social histories (including risk factors) reviewed for relevance to current acute and chronic problems.  Past Surgical History: Reviewed history from 01/20/2010 and no changes required. Denies surgical history  Family History: Reviewed history from 01/20/2010 and no changes required. mother-living-htn father-deceased-diabetic 3 sisters-healthy 2 brothers-healthy  Social History: Reviewed history from 01/20/2010 and no changes required. 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 ENT:  Denies earache, nasal congestion, postnasal drainage, sinus pressure, and sore throat. CV:  Complains of chest pain or discomfort; denies palpitations; RIB PAIN, WORSE WITH COUGH. Resp:  Complains of cough, shortness of breath, sputum productive, and wheezing; denies coughing up blood. Allergy:  Complains of seasonal allergies and sneezing.  Physical Exam  General:  Well-developed,well-nourished,in no acute  distress; alert,appropriate and cooperative throughout examination Head:  Normocephalic and atraumatic without obvious abnormalities. No apparent alopecia or balding. Ears:  External ear exam shows no significant lesions or deformities.  Otoscopic examination reveals clear canals, tympanic  membranes are intact bilaterally without bulging, retraction, inflammation or discharge. Hearing is grossly normal bilaterally. Nose:  External nasal examination shows no deformity or inflammation. Nasal mucosa are pink and moist without lesions or exudates.no sinus percussion tenderness.   Mouth:  Oral mucosa and oropharynx without lesions or exudates.  Teeth in good repair. Neck:  No deformities, masses, or tenderness noted. Lungs:  normal respiratory effort and no intercostal retractions.  End exp wheeze noted bilat with coarse BS.  After NMT treatment CTA. Heart:  Normal rate and regular rhythm. S1 and S2 normal without gallop, murmur, click, rub or other extra sounds. Cervical Nodes:  No lymphadenopathy noted Psych:  Cognition and judgment appear intact. Alert and cooperative with normal attention span and concentration. No apparent delusions, illusions, hallucinations   Impression & Recommendations:  Problem # 1:  ACUTE BRONCHOSPASM (ICD-519.11) Assessment New Pt to complete last 2 days of Prednisone 60 mg, then start todays prescription & will taper off. Will continue Advair at this time. Recommended to pt to use Albuterol inhaler every3-4 hrs as needed.  Problem # 2:  ACUTE BRONCHITIS (ICD-466.0) Assessment: New  His updated medication list for this problem includes:    Ventolin Hfa 108 (90 Base) Mcg/act Aers (Albuterol sulfate) .Marland Kitchen... 1 puff two times a day    Allegra-d 24 Hour 180-240 Mg Xr24h-tab (Fexofenadine-pseudoephedrine) .Marland Kitchen... Take 1 daily    Advair Diskus 250-50 Mcg/dose Aepb (Fluticasone-salmeterol) ..... One inhalation bid    Tessalon Perles 100 Mg Caps (Benzonatate) .Marland Kitchen... Take 1-2 every 8 hrs as needed for cough  Problem # 3:  ALLERGIC RHINITIS (ICD-477.9) Assessment: Deteriorated  Problem # 4:  NICOTINE ADDICTION (ICD-305.1) Assessment: Improved  Complete Medication List: 1)  Ventolin Hfa 108 (90 Base) Mcg/act Aers (Albuterol sulfate) .Marland Kitchen.. 1 puff two times a  day 2)  Allegra-d 24 Hour 180-240 Mg Xr24h-tab (Fexofenadine-pseudoephedrine) .... Take 1 daily 3)  Prednisone 20 Mg Tabs (Prednisone) .... Take 2 daily x 4 days, then 1 daily x 4 days, then 1/2 daily x 4 days 4)  Advair Diskus 250-50 Mcg/dose Aepb (Fluticasone-salmeterol) .... One inhalation bid 5)  Tessalon Perles 100 Mg Caps (Benzonatate) .... Take 1-2 every 8 hrs as needed for cough  Other Orders: T-Comprehensive Metabolic Panel [phone]) T-Lipid Profile ([id number]) T-CBC No Diff (69485-46270) T-TSH (35009-38182) T-PSA (99371-69678) T-Vitamin D (25-Hydroxy) (93810-17510) T-Culture, Sputum & Gram Stain (87070/87205-70030) Albuterol Sulfate Sol 1mg  unit dose (C5852) Nebulizer Tx (77824)  Patient Instructions: 1)  Schedule physical in 1-2 mos. 2)  I have ordered blood work for you to have drawn fasting before your physical. 3)  Tobacco is very bad for your health and your loved ones! You Should stop smoking!. 4)  Stop Smoking Tips: Choose a Quit date. Cut down before the Quit date. decide what you will do as a substitute when you feel the urge to smoke(gum,toothpick,exercise). Prescriptions: TESSALON PERLES 100 MG CAPS (BENZONATATE) take 1-2 every 8 hrs as needed for cough  #40 x 0   Entered and Authorized by:   Esperanza Sheets PA   Signed by:   Esperanza Sheets PA on 01/20/2010   Method used:   [facility] to        [facility] [phone]* (retail)       [address], [place]  [address]       Ph: [phone] or [phone]       Fax: [phone]   RxID:   [id number] ADVAIR DISKUS 250-50 MCG/DOSE AEPB (FLUTICASONE-SALMETEROL) one inhalation bid  #1 x 0   Entered and Authorized by:   Esperanza Sheets PA   Signed by:   Esperanza Sheets PA on 01/20/2010   Method used:   [facility] to        [facility] [phone]* (retail)       [address] [place]  [address]       Ph: [phone] or [phone]       Fax: [phone]   RxID:    [id number] PREDNISONE 20 MG TABS (PREDNISONE) take 2 daily x 4 days, then 1 daily x 4 days, then 1/2 daily x 4 days  #14 x 0   Entered and Authorized by:   Esperanza Sheets PA   Signed by:   Esperanza Sheets PA on 01/20/2010   Method used:   [facility] to        [facility]. [phone]* (retail)       [address] [place]  [address]       Ph: [phone] or [phone]       Fax: [phone]   RxID:   [id number] ALLEGRA-D 24 HOUR 180-240 MG XR24H-TAB (FEXOFENADINE-PSEUDOEPHEDRINE) take 1 daily  #30 x 0   Entered and Authorized by:   Esperanza Sheets PA   Signed by:   Esperanza Sheets PA on 01/20/2010   Method used:   [facility] to        [facility] [phone]* (retail)       [address], [place]  [address]       Ph: [phone] or [phone]       Fax: [phone]   RxID:   [id number]    Medication Administration  Medication # 1:    Medication: Albuterol Sulfate Sol 1mg  unit dose    Diagnosis: PRODUCTIVE COUGH (ICD-786.2)    Dose: 2.5/27ml    Route: inhaled    Exp Date: 08/2010    Lot #: [id number]    Mfr: nephron    Patient tolerated medication without complications    Given by: Everitt Amber LPN (January 20, 2010 5:08 PM)  Orders Added: 1)  T-Comprehensive Metabolic Panel [[id number]] 2)  T-Lipid Profile [phone] 3)  T-CBC No Diff [85027-10000] 4)  T-TSH [76734-19379] 5)  T-PSA [02409-73532] 6)  T-Vitamin D (25-Hydroxy) [99242-68341] 7)  T-Culture, Sputum & Gram Stain [87070/87205-70030] 8)  Albuterol Sulfate Sol 1mg  unit dose [J7613] 9)  Nebulizer Tx [94640] 10)  New Patient Level IV [96222]

## 2010-10-25 NOTE — Assessment & Plan Note (Signed)
Summary: ALLERGIES   Vital Signs:  Patient profile:   50 year old male Height:      72 inches Weight:      240.25 pounds BMI:     32.70 O2 Sat:      94 % on Room air Pulse rate:   112 / minute Resp:     16 per minute BP sitting:   134 / 80  (left arm)  Vitals Entered By: Adella Hare LPN (April 18, 2010 2:00 PM) CC: allergy flare up Is Patient Diabetic? No Pain Assessment Patient in pain? no        Primary Provider:  Esperanza Sheets PA  CC:  allergy flare up.  History of Present Illness: Pt presents today stating he has a flare up of his allergies.  He states he has chest congestion, nonprod cough, and wheezing.  He restarted in Advair 2 weeks ago and is using his albuterol for rescue twice a day.  His breathing and chest congestion is worse when he is outside in the heat and humidity.  No nasal congestion or sneezing.  No fever or chills.  Pt also requests a refill of his Omeprazole.  He states this is working well for him. Also has a few more weeks of Vit D left. Pt is still smoking.  He states he is signing up at work for the quit smoking program and wants to quit.   Current Medications (verified): 1)  Ventolin Hfa 108 (90 Base) Mcg/act Aers (Albuterol Sulfate) .Marland Kitchen.. 1 Puff Two Times A Day 2)  Advair Diskus 250-50 Mcg/dose Aepb (Fluticasone-Salmeterol) .... One Inhalation Bid 3)  Vitamin D (Ergocalciferol) 50000 Unit Caps (Ergocalciferol) .... One Cap By Mouth Every Week 4)  Omeprazole 20 Mg Cpdr (Omeprazole) .... Take 1 Daily  Allergies (verified): 1)  ! * Pcn  Past History:  Past medical history reviewed for relevance to current acute and chronic problems.  Past Medical History: Reviewed history from 02/08/2010 and no changes required. Hyperlipidemia Vitamin D Deficiency  Review of Systems General:  Denies chills and fever. ENT:  Denies earache, nasal congestion, sinus pressure, and sore throat. CV:  Denies chest pain or discomfort. Resp:  Complains of cough,  shortness of breath, and wheezing; denies sputum productive. Allergy:  Complains of seasonal allergies; denies itching eyes and sneezing.  Physical Exam  General:  Well-developed,well-nourished,in no acute distress; alert,appropriate and cooperative throughout examination Head:  Normocephalic and atraumatic without obvious abnormalities. No apparent alopecia or balding. Ears:  External ear exam shows no significant lesions or deformities.  Otoscopic examination reveals clear canals, tympanic membranes are intact bilaterally without bulging, retraction, inflammation or discharge. Hearing is grossly normal bilaterally. Nose:  External nasal examination shows no deformity or inflammation. Nasal mucosa are pink and moist without lesions or exudates. Mouth:  Oral mucosa and oropharynx without lesions or exudates.  Teeth in good repair. Neck:  No deformities, masses, or tenderness noted. Lungs:  Coarse bs bilat, no wheeze or rales. normal respiratory effort.   Heart:  Normal rate and regular rhythm. S1 and S2 normal without gallop, murmur, click, rub or other extra sounds. Cervical Nodes:  No lymphadenopathy noted Psych:  Cognition and judgment appear intact. Alert and cooperative with normal attention span and concentration. No apparent delusions, illusions, hallucinations   Impression & Recommendations:  Problem # 1:  ACUTE BRONCHITIS (ICD-466.0) Assessment New  His updated medication list for this problem includes:    Ventolin Hfa 108 (90 Base) Mcg/act Aers (Albuterol sulfate) .Marland KitchenMarland KitchenMarland KitchenMarland Kitchen  1 puff two times a day    Advair Diskus 250-50 Mcg/dose Aepb (Fluticasone-salmeterol) ..... One inhalation bid    Doxycycline Hyclate 100 Mg Caps (Doxycycline hyclate) .Marland Kitchen... Take 1 two times a day x 10 days  Take antibiotics and other medications as directed. Encouraged to push clear liquids, get enough rest, and take acetaminophen as needed. To be seen in 5-7 days if no improvement, sooner if worse.  Problem #  2:  HYPERLIPIDEMIA (ICD-272.4) Assessment: Comment Only  Orders: T-Lipid Profile ([id number])  Problem # 3:  VITAMIN D DEFICIENCY (ICD-268.9) Assessment: Comment Only Advised to start over the counter Vit D once completes prescription.  Orders: T-Assay of Vitamin D [phone])  Problem # 4:  ESOPHAGEAL REFLUX (ICD-530.81) Assessment: Improved  His updated medication list for this problem includes:    Omeprazole 20 Mg Cpdr (Omeprazole) .Marland Kitchen... Take 1 daily  Complete Medication List: 1)  Ventolin Hfa 108 (90 Base) Mcg/act Aers (Albuterol sulfate) .Marland Kitchen.. 1 puff two times a day 2)  Advair Diskus 250-50 Mcg/dose Aepb (Fluticasone-salmeterol) .... One inhalation bid 3)  Vitamin D (ergocalciferol) 50000 Unit Caps (Ergocalciferol) .... One cap by mouth every week 4)  Omeprazole 20 Mg Cpdr (Omeprazole) .... Take 1 daily 5)  Doxycycline Hyclate 100 Mg Caps (Doxycycline hyclate) .... Take 1 two times a day x 10 days  Other Orders: Depo- Medrol 80mg  (J1040) Admin of Therapeutic Inj  intramuscular or subcutaneous (46270)  Patient Instructions: 1)  Follow up appt in 6-8 weeks. 2)  Have your blood work drawn the end of August or beginning of Sept. 3)  I have prescribed an antibiotic for you. 4)  You have received a shot of Depo Medrol today. 5)  Continue your inhalers. 6)  Tobacco is very bad for your health and your loved ones! You Should stop smoking!. 7)  Stop Smoking Tips: Choose a Quit date. Cut down before the Quit date. decide what you will do as a substitute when you feel the urge to smoke(gum,toothpick,exercise). 8)  When you complete your Vit D prescription, start taking over the counter Vit D 1000 international units once a day. Prescriptions: DOXYCYCLINE HYCLATE 100 MG CAPS (DOXYCYCLINE HYCLATE) take 1 two times a day x 10 days  #20 x 0   Entered and Authorized by:   Esperanza Sheets PA   Signed by:   Esperanza Sheets PA on 04/18/2010   Method used:   [facility]        [facility] [phone]* (retail)       [address]       [place], [place]  [address]       Ph: [phone] or [phone]       Fax: [phone]   RxID:   [id number] OMEPRAZOLE 20 MG CPDR (OMEPRAZOLE) take 1 daily  #30 x 11   Entered and Authorized by:   Esperanza Sheets PA   Signed by:   Esperanza Sheets PA on 04/18/2010   Method used:   [facility] to        [facility] [phone]* (retail)       [address], [place]  [address]       Ph: [phone] or [phone]       Fax: [phone]   RxID:   [id number] ADVAIR DISKUS 250-50 MCG/DOSE AEPB (FLUTICASONE-SALMETEROL) one inhalation bid  #1 x 0   Entered and Authorized by:   Esperanza Sheets PA  Signed by:   Esperanza Sheets PA on 04/18/2010   Method used:   [facility]        [facility] [phone]* (retail)       [address], [place]  [address]       Ph: [phone] or [phone]       Fax: [phone]   RxID:   [id number]    Medication Administration  Injection # 1:    Medication: Depo- Medrol 80mg     Diagnosis: ALLERGIC RHINITIS (ICD-477.9)    Route: IM    Site: RUOQ gluteus    Exp Date: 2/12    Lot #: [id number]    Mfr: Pharm[employer]    Patient tolerated injection without complications    Given by: Adella Hare LPN (April 18, 2010 3:19 PM)  Orders Added: 1)  T-Lipid Profile [id number] 2)  T-Assay of Vitamin D [phone] 3)  Depo- Medrol 80mg  [J1040] 4)  Admin of Therapeutic Inj  intramuscular or subcutaneous [96372] 5)  Est. Patient Level IV [51884]

## 2010-10-25 NOTE — Progress Notes (Signed)
Summary: call  Phone Note Call from Patient   Summary of Call: call him back @ [phone] no information sry Initial call taken by: Lind Guest,  May 11, 2010 9:16 AM  Follow-up for Phone Call        returned call, busy signal Follow-up by: Adella Hare LPN,  May 11, 2010 10:58 AM  Additional Follow-up for Phone Call Additional follow up Details #1::        no fever, chills, body aches head and chest congestion, cough, yellow sputum was just seen 7/25 does he need to come back in? Additional Follow-up by: Adella Hare LPN,  May 11, 2010 3:11 PM    Additional Follow-up for Phone Call Additional follow up Details #2::    If he is having any difficulty breathing or wheezing, then yes, must have an appt. If no, then prescription Z-Pal #1 no refill. Advise pt that if he does not improve in the next few days, or if he feels that he is worsening then he must be seen.   Follow-up by: Esperanza Sheets PA,  May 11, 2010 3:21 PM  Additional Follow-up for Phone Call Additional follow up Details #3:: Details for Additional Follow-up Action Taken: yes trouble breathing, advised to schedule appt. Additional Follow-up by: Adella Hare LPN,  May 11, 2010 4:06 PM

## 2010-10-25 NOTE — Progress Notes (Signed)
  Phone Note From Pharmacy   [facility] [phone]* Summary of Call: crestor 5mg  not covered alternative? Initial call taken by: Adella Hare LPN,  June 10, 2010 2:32 PM  Follow-up for Phone Call        New prescription for Pravastatin sent. Follow-up by: Esperanza Sheets PA,  June 10, 2010 3:41 PM  Additional Follow-up for Phone Call Additional follow up Details #1::        new rx sent in to replace crestor Additional Follow-up by: Everitt Amber LPN,  June 13, 2010 8:09 AM    New/Updated Medications: PRAVASTATIN SODIUM 20 MG TABS (PRAVASTATIN SODIUM) take 1 tab daily at bedtime Prescriptions: PRAVASTATIN SODIUM 20 MG TABS (PRAVASTATIN SODIUM) take 1 tab daily at bedtime  #30 x 3   Entered and Authorized by:   Esperanza Sheets PA   Signed by:   Esperanza Sheets PA on 06/10/2010   Method used:   [facility] to        [facility] [phone]* (retail)       [address]       [place], [place]  [address]       Ph: [phone] or [phone]       Fax: [phone]   RxID:   [id number]

## 2010-10-27 NOTE — Assessment & Plan Note (Signed)
Summary: office visit   Vital Signs:  Patient profile:   50 year old male Height:      72 inches Weight:      238.25 pounds O2 Sat:      97 % on Room air Pulse rate:   104 / minute Pulse rhythm:   regular Resp:     16 per minute BP sitting:   120 / 80  (right arm)  Vitals Entered By: Adella Hare LPN (September 06, 2010 11:37 AM)  O2 Flow:  Room air CC: follow-up visit Is Patient Diabetic? No   Primary Care Provider:  Syliva Overman MD  CC:  follow-up visit.  History of Present Illness: Reports  that he has been doing fairly well Denies recent fever or chills.  Denies chest pain, palpitations, PND, orthopnea or leg swelling. Denies abdominal pain, nausea, vomitting, diarrhea or constipation. Denies change in bowel movements or bloody stool. Denies dysuria , frequency, incontinence or hesitancy. Denies  joint pain, swelling, or reduced mobility. Denies headaches, vertigo, seizures. Denies depression, anxiety or insomnia. Denies  rash, lesions, or itch.     Current Medications (verified): 1)  Ventolin Hfa 108 (90 Base) Mcg/act Aers (Albuterol Sulfate) .Marland Kitchen.. 1 Puff Two Times A Day 2)  Advair Diskus 250-50 Mcg/dose Aepb (Fluticasone-Salmeterol) .... One Inhalation Two Times A Day. 3)  Vitamin D 1000 Unit Tabs (Cholecalciferol) .... Take 1 Daily 4)  Omeprazole 20 Mg Cpdr (Omeprazole) .... Take 1 Daily 5)  Aspirin 81 Mg Tbec (Aspirin) .... Take 1 Daily 6)  Fish Oil 1000 Mg Caps (Omega-3 Fatty Acids) .... Take 1 Daily 7)  Pravastatin Sodium 20 Mg Tabs (Pravastatin Sodium) .... Take 1 Tab Daily At Bedtime 8)  Astepro 0.15 % Soln (Azelastine Hcl) 9)  Flonase 50 Mcg/act Susp (Fluticasone Propionate) 10)  Claritin-D 24 Hour 10-240 Mg Xr24h-Tab (Loratadine-Pseudoephedrine) .... One Tab By Mouth Once Daily  Allergies (verified): 1)  ! * Pcn  Past History:  Past medical, surgical, family and social histories (including risk factors) reviewed for relevance to current acute  and chronic problems.  Past Medical History: Reviewed history from 07/11/2010 and no changes required. Hyperlipidemia Vitamin D Deficiency COPD  Past Surgical History: Reviewed history from 01/20/2010 and no changes required. Denies surgical history  Family History: Reviewed history from 01/20/2010 and no changes required. mother-living-htn father-deceased-diabetic 3 sisters-healthy 2 brothers-healthy  Social History: Reviewed history from 01/20/2010 and no changes required. 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 ENT:  Complains of hoarseness, nasal congestion, and postnasal drainage. Resp:  Complains of cough; denies sputum productive and wheezing. Endo:  Denies cold intolerance, excessive hunger, excessive thirst, and excessive urination. Heme:  Denies abnormal bruising, bleeding, enlarge lymph nodes, and pallor. Allergy:  Complains of seasonal allergies.  Physical Exam  General:  Well-developed,obese,in no acute distress; alert,appropriate and cooperative throughout examination HEENT: No facial asymmetry,  EOMI, No sinus tenderness, TM's Clear, oropharynx  pink and moist.   Chest: reduced air entry, no crackles or wheezes CVS: S1, S2, No murmurs, No S3.   Abd: Soft, Nontender.  MS: Adequate ROM spine, hips, shoulders and knees.  Ext: No edema.   CNS: CN 2-12 intact, power tone and sensation normal throughout.   Skin: Intact, no visible lesions or rashes.  Psych: Good eye contact, normal affect.  Memory intact, not anxious or  depressed appearing.    Impression & Recommendations:  Problem # 1:  COPD (ICD-496) Assessment Unchanged  His updated medication list for this problem includes:    Ventolin Hfa 108 (90 Base) Mcg/act Aers (Albuterol sulfate) .Marland Kitchen... 1 puff two times a day    Advair  Diskus 250-50 Mcg/dose Aepb (Fluticasone-salmeterol) ..... One inhalation two times a day.  Problem # 2:  HYPERLIPIDEMIA (ICD-272.4) Assessment: Improved  The following medications were removed from the medication list:    Pravastatin Sodium 20 Mg Tabs (Pravastatin sodium) .Marland Kitchen... Take 1 tab daily at bedtime His updated medication list for this problem includes:    Pravastatin Sodium 40 Mg Tabs (Pravastatin sodium) .Marland Kitchen... Take 1 tab by mouth at bedtime Low fat dietdiscussed and encouraged  Orders: T-Lipid Profile [id number]) T-Hepatic Function [phone])  Labs Reviewed: SGOT: 18 (09/01/2010)   SGPT: 21 (09/01/2010)   HDL:36 (09/01/2010), 34 (06/08/2010)  LDL:137 (09/01/2010), 164 (06/08/2010)  Chol:204 (09/01/2010), 224 (06/08/2010)  Trig:155 (09/01/2010), 131 (06/08/2010)  Problem # 3:  NICOTINE ADDICTION (ICD-305.1) Assessment: Unchanged  Encouraged smoking cessation and discussed different methods for smoking cessation.   Problem # 4:  OBESITY (ICD-278.00) Assessment: Unchanged  Ht: 72 (09/06/2010)   Wt: 238.25 (09/06/2010)   BMI: 31.99 (06/09/2010)  Problem # 5:  ALLERGIC RHINITIS (ICD-477.9) Assessment: Deteriorated  The following medications were removed from the medication list:    Flonase 50 Mcg/act Susp (Fluticasone propionate) His updated medication list for this problem includes:    Astepro 0.15 % Soln (Azelastine hcl)    Nasonex 50 Mcg/act Susp (Mometasone furoate) ..... Use as directed  Complete Medication List: 1)  Ventolin Hfa 108 (90 Base) Mcg/act Aers (Albuterol sulfate) .Marland Kitchen.. 1 puff two times a day 2)  Advair Diskus 250-50 Mcg/dose Aepb (Fluticasone-salmeterol) .... One inhalation two times a day. 3)  Vitamin D 1000 Unit Tabs (Cholecalciferol) .... Take 1 daily 4)  Omeprazole 20 Mg Cpdr (Omeprazole) .... Take 1 daily 5)  Aspirin 81 Mg Tbec (Aspirin) .... Take 1 daily 6)  Fish Oil 1000 Mg Caps (Omega-3 fatty acids) .... Take 1 daily 7)  Astepro 0.15 %  Soln (Azelastine hcl) 8)  Claritin-d 24 Hour 10-240 Mg Xr24h-tab (Loratadine-pseudoephedrine) .... One tab by mouth once daily 9)  Prednisone (pak) 5 Mg Tabs (Prednisone) .... Use as directed 10)  Pravastatin Sodium 40 Mg Tabs (Pravastatin sodium) .... Take 1 tab by mouth at bedtime 11)  Nasonex 50 Mcg/act Susp (Mometasone furoate) .... Use as directed  Other Orders: T- Hemoglobin A1C ([id number])  Patient Instructions: 1)  Please schedule a follow-up appointment in 4 months. 2)  It is important that you exercise regularly at least 20 minutes 5 times a week. If you develop chest pain, have severe difficulty breathing, or feel very tired , stop exercising immediately and seek medical attention. 3)  You need to lose weight. Consider a lower calorie diet and regular exercise.  4)  Pls reduce sweetes and carbs. 5)  Pls eat more white meat, and less fried and fatty foods.Marland Kitchen 6)  Drink no cal, or low cal drinks so you do lose weight and reduce yourt risk of becoming diabetic 7)  Hepatic Panel prior to visit, ICD-9: 8)  Lipid Panel prior to visit, ICD-9:  fasting 9)  HbgA1C prior to visit, ICD-9: Prescriptions: PRAVASTATIN SODIUM 40 MG TABS (PRAVASTATIN SODIUM) Take 1 tab by mouth at bedtime  #30 x 3   Entered and Authorized by:   Syliva Overman MD   Signed by:  Syliva Overman MD on 09/06/2010   Method used:   Printed then [facility]ed [facility] ...       [facility] [place] [phone]* (retail)       [id number]       [place], [place]  [address]       Ph: [phone] or [phone]       Fax: [phone]   RxID:   [id number] PREDNISONE (PAK) 5 MG TABS (PREDNISONE) Use as directed  #21 x 0   Entered and Authorized by:   Syliva Overman MD   Signed by:   Syliva Overman MD on 09/06/2010   Method used:   [facility] to        [facility] [phone]* (retail)       [address] [place]  [address]       Ph: [phone] or [phone]       Fax: [phone]   RxID:    [id number]    Orders Added: 1)  Est. Patient Level IV [62376] 2)  T-Lipid Profile [80061-22930] 3)  T-Hepatic Function [80076-22960] 4)  T- Hemoglobin A1C [83036-23375]

## 2010-10-27 NOTE — Letter (Signed)
Summary: DOSE INCREASE  DOSE INCREASE   Imported By: Lind Guest 09/06/2010 14:18:29  _____________________________________________________________________  External Attachment:    Type:   Image     Comment:   External Document

## 2010-10-27 NOTE — Progress Notes (Signed)
Summary: fyi  Phone Note Other Incoming   Caller: Kellie Moor Summary of Call: called in and wanting an appt. for Mr. Timothy Chung states he is coughing really bad so bad that he vomits.  I offered her the only appt. I saw this week which was for Friday and she said no.  And someone in the back ground was saying can't they work him in.  I told her this was a work in appt.  she didn't take the appt. for patient. Initial call taken by: Curtis Sites,  October 03, 2010 8:51 AM  Follow-up for Phone Call        noted Follow-up by: Adella Hare LPN,  October 03, 2010 9:13 AM

## 2010-10-27 NOTE — Assessment & Plan Note (Signed)
Summary: Pulmonary consultation/ copd vs asthma/ ? L hilar abn   Visit Type:  Initial Consult Copy to:  Syliva Overman MD Primary Provider/Referring Provider:  Syliva Overman MD  CC:  Abnormal CXR.Marland Kitchen  History of Present Illness: 73 yobm smoker with h/o ab on intermittent inhalers since April 2011 eval by Dr Willa Rough allergic dust pollen and mold rx with nasonex.  October 10, 2010  1st pulmonary office eval cc sob/ wheezing  better, no coughing, not using advair since 1/9 when given prednisone for flare while on advair.  symbicort made him feel dizzy, not needing saba at present.  no active cough or discolored sputum. Pt denies any significant sore throat, dysphagia, itching, sneezing,  nasal congestion or excess secretions,  fever, chills, sweats, unintended wt loss, pleuritic or exertional cp, hempoptysis, change in activity tolerance  orthopnea pnd or leg swelling Pt also denies any obvious fluctuation in symptoms with weather or environmental change or other alleviating or aggravating factors.           Preventive Screening-Counseling & Management  Alcohol-Tobacco     Smoking Status: current     Smoking Cessation Counseling: yes  Current Medications (verified): 1)  Ventolin Hfa 108 (90 Base) Mcg/act Aers (Albuterol Sulfate) .... 2 Puffs Every 4-6 Hrs As Needed 2)  Vitamin D 1000 Unit Tabs (Cholecalciferol) .... Take 1 Daily 3)  Omeprazole 20 Mg Cpdr (Omeprazole) .... Take 1 Daily 4)  Aspirin 81 Mg Tbec (Aspirin) .... Take 1 Daily 5)  Claritin-D 24 Hour 10-240 Mg Xr24h-Tab (Loratadine-Pseudoephedrine) .... One Tab By Mouth Once Daily 6)  Pravastatin Sodium 40 Mg Tabs (Pravastatin Sodium) .... Take 1 Tab By Mouth At Bedtime 7)  Nasonex 50 Mcg/act Susp (Mometasone Furoate) .... Use As Directed As Needed 8)  Advair Diskus 250-50 Mcg/dose Aepb (Fluticasone-Salmeterol) .... One Puff Twice Daily  Allergies (verified): 1)  ! 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  Quit 09/24/10- smoked approx 22 yrs up to 1/2 ppd Alcohol use-yes-occasional Regular exercise-no  Review of Systems       The patient complains of shortness of breath at rest, productive cough, acid heartburn, indigestion, nasal congestion/difficulty breathing through nose, and change in color of mucus.  The patient denies shortness of breath with activity, non-productive cough, coughing up blood, chest pain, irregular heartbeats, loss of appetite, weight change, abdominal pain, difficulty swallowing, sore throat, tooth/dental problems, headaches, sneezing, itching, ear ache, anxiety, depression, hand/feet swelling, joint stiffness or pain, rash, and fever.    Vital Signs:  Patient profile:   50 year old male Height:      70 inches Weight:      240.25 pounds O2 Sat:      96 % on Room air Temp:     97.9 degrees F oral Pulse rate:   103 / minute BP sitting:   142 / 80  (left arm) Cuff size:   large  Vitals Entered By: Vernie Murders (October 10, 2010 11:23 AM)  O2 Flow:  Room air  Physical Exam  Additional Exam:  wt 238 > 240 October 10, 2010 HEENT mild turbinate edema.  Oropharynx no thrush or excess pnd or cobblestoning.  No JVD or  cervical adenopathy. Mild accessory muscle hypertrophy. Trachea midline, nl thryroid. Chest was hyperinflated by percussion with diminished breath sounds and moderate increased exp time without wheeze. Hoover sign positive at mid inspiration. Regular rate and rhythm without murmur gallop or rub or increase P2 or edema.  Abd: no hsm, nl excursion. Ext warm without cyanosis or clubbing.     Impression &  Recommendations:  Problem # 1:  COPD (ICD-496)   DDX of  difficult airways managment all start with A and  include Adherence, Ace Inhibitors, Acid Reflux, Active Sinus Disease, Alpha 1 Antitripsin deficiency, Anxiety masquerading as Airways dz,  ABPA,  allergy(esp in young), Aspiration (esp in elderly), Adverse effects of DPI,  Active smokers, plus one B  = Beta blocker use..   Adherence:  I spent extra time with the patient today explaining optimal mdi  technique.  This improved from  25-75% p coaching  Active smoking See #2   Problem # 2:  NICOTINE ADDICTION (ICD-305.1)  I emphasized that although we never turn away smokers from the pulmonary clinic, we do ask that they understand that the recommendations that were made won't work nearly as well in the presence of continued cigarette exposure and we may reach a point where we can't help the patient if he/she can't quit smoking.    Problem # 3:  RADIOLOGICAL EXAMINATION NEC (ICD-V72.5) reviewed serial cxrs not convinced of L hilar abn will f/u cxr in 6 weeks  Medications Added to Medication List This Visit: 1)  Nasonex 50 Mcg/act Susp (Mometasone furoate) .... Use as directed as needed 2)  Ventolin Hfa 108 (90 Base) Mcg/act Aers (Albuterol sulfate) .... 2 puffs every 4-6 hrs as needed 3)  Dulera 100-5 Mcg/act Aero (Mometasone furo-formoterol fum) .... 2 puffs every 12 hours if needed  Other Orders: Consultation Level V (16109)  Patient Instructions: 1)  Dulera 100 2 puffs every 12 hours if needed 2)  Stop advair and only use ventolin in emergency 3)  Work on inhaler technique:  relax and blow all the way out then take a nice smooth deep breath back in, triggering the inhaler at same time you start breathing in and rinse mouth 4)  Maintaining off cigarettes if the most important aspect of your care 5)  Please schedule a follow-up appointment in 6 weeks, sooner if needed with cxr and pft's

## 2010-10-27 NOTE — Letter (Signed)
Summary: Historic Patient File  Historic Patient File   Imported By: Lind Guest 10/05/2010 13:10:05  _____________________________________________________________________  External Attachment:    Type:   Image     Comment:   External Document

## 2010-10-27 NOTE — Assessment & Plan Note (Signed)
Summary: OV   Vital Signs:  Patient profile:   50 year old male Height:      72 inches Weight:      238 pounds BMI:     32.40 O2 Sat:      94 % Pulse rate:   81 / minute Pulse rhythm:   regular Resp:     16 per minute BP sitting:   134 / 80  (left arm) Cuff size:   large  Vitals Entered By: Everitt Amber LPN (October 04, 2010 1:13 PM) CC: Went to ER yesterday for bronchitis and asthma, feeling alot better than he was   Primary Care Provider:  Syliva Overman MD  CC:  Micah Flesher to ER yesterday for bronchitis and asthma and feeling alot better than he was.  History of Present Illness: Pt has had 2 episodes of severe  chest tightness with difficulty breathing, yesterday , he was seen at [facility]/ed, and before that in  April 2011, and was seen at [facility].  He states since 2008, he noted coughing and chest tightness during feb thru March. States in 2010, he performed poorly in the breathing test thru his job, he works in a [employer] since 2006  Yesterday, when he was in the ed he repts coughing yellow sputum, like a plug, and states he was told his breathing  improved with no wheezes heard after he coughed up the plug.   3 day h/o preceeding acute decompensation of cough with sputum, and progressive chest tightness Currently on prednisone dose back and macrolide antibiotic. Pt anxious , wants to know whether he does indeed have asthma requests pul eval asap, of note a PFT last yr showed no significant bronchodilator response  Allergies: 1)  ! * Pcn  Review of Systems      See HPI General:  Complains of fatigue. Eyes:  Denies discharge, eye pain, and red eye. Resp:  Complains of cough, shortness of breath, and wheezing; recent Ed eval for rept resp decompensation with wheezing , also repts abn cxr, anxious wants to know whether he does have asthma or he does not. Endo:  Denies cold intolerance, excessive hunger, excessive thirst, and excessive  urination. Heme:  Denies abnormal bruising and bleeding. Allergy:  Denies hives or rash and itching eyes.  Physical Exam  General:  Well-developed,obese,in no acute distress; alert,appropriate and cooperative throughout examination HEENT: No facial asymmetry,  EOMI, No sinus tenderness, TM's Clear, oropharynx  pink and moist.   Chest: decreased air entry, no wheezes orcrackles heard CVS: S1, S2, No murmurs, No S3.   Abd: Soft, Nontender.  MS: Adequate ROM spine, hips, shoulders and knees.  Ext: No edema.   CNS: CN 2-12 intact, power tone and sensation normal throughout.   Skin: Intact, no visible lesions or rashes.  Psych: Good eye contact, normal affect.  Memory intact, not anxious or depressed appearing.    Impression & Recommendations:  Problem # 1:  RADIOLOGICAL EXAMINATION NEC (ICD-V72.5) Assessment Comment Only  Orders: Pulmonary Referral (Pulmonary)  Problem # 2:  COPD (ICD-496) Assessment: Deteriorated  His updated medication list for this problem includes:    Ventolin Hfa 108 (90 Base) Mcg/act Aers (Albuterol sulfate) .Marland Kitchen... 2 puffs every 4-6 hrs as needed    Dulera 100-5 Mcg/act Aero (Mometasone furo-formoterol fum) .Marland Kitchen... 2 puffs every 12 hours if needed  Orders: Pulmonary Referral (Pulmonary)  Problem # 3:  OBESITY (ICD-278.00) Assessment: Comment Only  Ht: 72 (10/04/2010)   Wt: 238 (  10/04/2010)   BMI: 32.40 (10/04/2010) therapeutic lifestyle change discussed and encouraged  Problem # 4:  HYPERLIPIDEMIA (ICD-272.4) Assessment: Comment Only  His updated medication list for this problem includes:    Pravastatin Sodium 40 Mg Tabs (Pravastatin sodium) .Marland Kitchen... Take 1 tab by mouth at bedtime Low fat dietdiscussed and encouraged  Labs Reviewed: SGOT: 18 (09/01/2010)   SGPT: 21 (09/01/2010)   HDL:36 (09/01/2010), 34 (06/08/2010)  LDL:137 (09/01/2010), 164 (06/08/2010)  Chol:204 (09/01/2010), 224 (06/08/2010)  Trig:155 (09/01/2010), 131 (06/08/2010)  Complete  Medication List: 1)  Vitamin D 1000 Unit Tabs (Cholecalciferol) .... Take 1 daily 2)  Omeprazole 20 Mg Cpdr (Omeprazole) .... Take 1 daily 3)  Aspirin 81 Mg Tbec (Aspirin) .... Take 1 daily 4)  Claritin-d 24 Hour 10-240 Mg Xr24h-tab (Loratadine-pseudoephedrine) .... One tab by mouth once daily 5)  Pravastatin Sodium 40 Mg Tabs (Pravastatin sodium) .... Take 1 tab by mouth at bedtime 6)  Nasonex 50 Mcg/act Susp (Mometasone furoate) .... Use as directed as needed 7)  Ventolin Hfa 108 (90 Base) Mcg/act Aers (Albuterol sulfate) .... 2 puffs every 4-6 hrs as needed 8)  Dulera 100-5 Mcg/act Aero (Mometasone furo-formoterol fum) .... 2 puffs every 12 hours if needed  Patient Instructions: 1)  F/U as before. 2)  You are being referred urgently to a pulmonary specialist. 3)  Congrats on quitting. 4)  You will need rept imaging of your chest in1 month, and I will ask the pulmonologist to determine/request the best test, whether a CXr or Chesrt CT scan Prescriptions: ADVAIR DISKUS 250-50 MCG/DOSE AEPB (FLUTICASONE-SALMETEROL) one puff twice daily  #1 x 3   Entered and Authorized by:   Syliva Overman MD   Signed by:   Syliva Overman MD on 10/04/2010   Method used:   Electronically to        [facility] [phone]* (retail)       [address] [place]  [address]       Ph: [phone] or [phone]       Fax: [phone]   RxID:   [id number]    Orders Added: 1)  Est. Patient Level IV [36644] 2)  Pulmonary Referral [Pulmonary]

## 2010-11-01 ENCOUNTER — Telehealth: Payer: Self-pay | Admitting: Family Medicine

## 2010-11-10 NOTE — Progress Notes (Signed)
  Phone Note Call from Patient   Caller: Patient Summary of Call: patient called in and states his sexual partner, who is a patient here and is being treated for trich, advised him to call and get medication for treatment for trich   [phone] Initial call taken by: Adella Hare LPN,  November 01, 2010 2:58 PM  Follow-up for Phone Call        pls let pt know script sent one time four tabs, ane send in historically entered Follow-up by: Syliva Overman MD,  November 01, 2010 3:51 PM    New/Updated Medications: METRONIDAZOLE 500 MG TABS (METRONIDAZOLE) four tablets at one time Prescriptions: METRONIDAZOLE 500 MG TABS (METRONIDAZOLE) four tablets at one time  #4 x 0   Entered by:   Adella Hare LPN   Authorized by:   Syliva Overman MD   Signed by:   Adella Hare LPN on 09/81/1914   Method used:   Electronically to        [facility] [phone]* (retail)       [address]       [place], [place]  [address]       Ph: [phone] or [phone]       Fax: [phone]   RxID:   [id number]  patient aware

## 2010-11-23 ENCOUNTER — Encounter: Payer: Self-pay | Admitting: Internal Medicine

## 2010-11-23 ENCOUNTER — Other Ambulatory Visit: Payer: Self-pay | Admitting: Internal Medicine

## 2010-11-23 ENCOUNTER — Ambulatory Visit (INDEPENDENT_AMBULATORY_CARE_PROVIDER_SITE_OTHER): Payer: BC Managed Care – PPO | Admitting: Internal Medicine

## 2010-11-23 ENCOUNTER — Encounter (INDEPENDENT_AMBULATORY_CARE_PROVIDER_SITE_OTHER): Payer: BC Managed Care – PPO

## 2010-11-23 ENCOUNTER — Ambulatory Visit (INDEPENDENT_AMBULATORY_CARE_PROVIDER_SITE_OTHER)
Admission: RE | Admit: 2010-11-23 | Discharge: 2010-11-23 | Disposition: A | Discharge status: Home / Self Care | Payer: BC Managed Care – PPO | Source: Ambulatory Visit | Attending: Internal Medicine | Admitting: Internal Medicine

## 2010-11-23 DIAGNOSIS — J449 Chronic obstructive pulmonary disease, unspecified: Secondary | ICD-10-CM

## 2010-11-23 DIAGNOSIS — R05 Cough: Secondary | ICD-10-CM

## 2010-11-23 DIAGNOSIS — J45909 Unspecified asthma, uncomplicated: Secondary | ICD-10-CM | POA: Insufficient documentation

## 2010-12-01 NOTE — Letter (Signed)
Summary: allergy and asthma  allergy and asthma   Imported By: Lind Guest 11/25/2010 14:50:22  _____________________________________________________________________  External Attachment:    Type:   Image     Comment:   External Document

## 2010-12-01 NOTE — Assessment & Plan Note (Addendum)
Summary: Pulmonary/ summary ext final ov > pft's wnl   Copy to:  Syliva Overman MD Primary Provider/Referring Provider:  Syliva Overman MD  CC:  Abnormal CXR.  History of Present Illness: 49 yobm quit smoking Sep 25 2011  with h/o ab on intermittent inhalers since April 2011 eval by Dr Willa Rough allergic dust pollen and mold rx with nasonex.  October 10, 2010  1st pulmonary office eval cc sob/ wheezing  better, no coughing, not using advair since 1/9 when given prednisone for flare while on advair.  symbicort made him feel dizzy, not needing saba at present.  no active cough or discolored sputum. rec try off symbicort and use dulera as needed pending return for pft's  November 23, 2010 ov  return for pfts cc doe with heavy exertion  not actively smoking, no cough. Pt denies any significant sore throat, dysphagia, itching, sneezing,  nasal congestion or excess secretions,  fever, chills, sweats, unintended wt loss, pleuritic or exertional cp, hempoptysis, change in activity tolerance  orthopnea pnd or leg swelling Pt also denies any obvious fluctuation in symptoms with weather or environmental change or other alleviating or aggravating factors.          Current Medications (verified): 1)  Vitamin D 1000 Unit Tabs (Cholecalciferol) .... Take 1 Daily 2)  Omeprazole 20 Mg Cpdr (Omeprazole) .... Take 1 Daily 3)  Aspirin 81 Mg Tbec (Aspirin) .... Take 1 Daily 4)  Claritin-D 24 Hour 10-240 Mg Xr24h-Tab (Loratadine-Pseudoephedrine) .... One Tab By Mouth Once Daily 5)  Pravastatin Sodium 40 Mg Tabs (Pravastatin Sodium) .... Take 1 Tab By Mouth At Bedtime 6)  Ventolin Hfa 108 (90 Base) Mcg/act Aers (Albuterol Sulfate) .... 2 Puffs Every 4-6 Hrs As Needed 7)  Dulera 100-5 Mcg/act Aero (Mometasone Furo-Formoterol Fum) .... 2 Puffs Every 12 Hours If Needed  Allergies (verified): 1)  ! * Pcn  Past History:  Past Medical History: Hyperlipidemia Vitamin D Deficiency COPD, clinical dx only     -  HFA 75% October 10, 2010     - PFT's 11/23/10 wnl  Vital Signs:  Patient profile:   50 year old male Height:      72 inches Weight:      243 pounds O2 Sat:      95 % on Room air Temp:     98.5 degrees F oral Pulse rate:   105 / minute BP sitting:   120 / 80  (left arm) Cuff size:   large  Vitals Entered ByVernie Murders (November 23, 2010 10:37 AM)  O2 Flow:  Room air  Physical Exam  Additional Exam:  wt 238 > 240 October 10, 2010 > 243 November 23, 2010  HEENT mild turbinate edema.  Oropharynx no thrush or excess pnd or cobblestoning.  No JVD or cervical adenopathy. Mild accessory muscle hypertrophy. Trachea midline, nl thryroid. Chest was hyperinflated by percussion with diminished breath sounds and moderate increased exp time without wheeze. Hoover sign positive at mid inspiration. Regular rate and rhythm without murmur gallop or rub or increase P2 or edema.  Abd: no hsm, nl excursion. Ext warm without cyanosis or clubbing.     CXR  Procedure date:  11/23/2010  Findings:      Comparison: 10/03/2010  Findings: Lungs are grossly clear, noting mild chronic interstitial markings. No pleural effusion or pneumothorax.  Cardiomediastinal silhouette is within normal limits.  Visualized osseous structures are within normal limits.  IMPRESSION: No evidence of acute cardiopulmonary disease.  Impression &  Recommendations:  Problem # 1:  ASTHMA, UNSPECIFIED (ICD-493.90) suspect he does have mild asthma that will flare if smokes or if exposed to triggers but this appears intermittent, not chronic.  I had an extended discussion with the patient today lasting 15 to 20 minutes of a 25 minute visit on the following issues:   I discussed the importance of understanding the goals of asthma therapy including the rule of 2's as it applies to the use of a rescue inhaler.   I spent extra time with the patient today explaining optimal mdi  technique.  This improved from  50-90% p  coaching  See instructions for specific recommendations   Medications Added to Medication List This Visit: 1)  Dulera 100-5 Mcg/act Aero (Mometasone furo-formoterol fum) .... 2 puffs every 12 hours if needed  Other Orders: T-2 View CXR (71020TC) Est. Patient Level IV (16109)  Patient Instructions: 1)  Dulera 100 2 puffs every 12 hours if needed 2)  your lung function is excellent and as you don't smoke it will remain so indefinitely. 3)  You may have asthma or be prone to it and if you active symptoms coughing wheeze or short of breath ok to use dulera up to 2 every 12hours and let Dr Lodema Hong reflll in a year and if not doing well you definitely need to return here.  Prescriptions: DULERA 100-5 MCG/ACT AERO (MOMETASONE FURO-FORMOTEROL FUM) 2 puffs every 12 hours if needed  #1 x 11   Entered and Authorized by:   Nyoka Cowden MD   Signed by:   Nyoka Cowden MD on 11/23/2010   Method used:   Print then Give to Patient   RxID:   [id number]

## 2010-12-01 NOTE — Assessment & Plan Note (Signed)
Summary: pft charges   Allergies: 1)  ! Pcn   Other Orders: Carbon Monoxide diffusing w/capacity (94729) Lung Volumes/Gas dilution or washout (94727) Spirometry (Pre & Post) (94060) 

## 2010-12-05 ENCOUNTER — Emergency Department (HOSPITAL_BASED_OUTPATIENT_CLINIC_OR_DEPARTMENT_OTHER)
Admission: EM | Admit: 2010-12-05 | Discharge: 2010-12-05 | Disposition: A | Discharge status: Home / Self Care | Payer: BC Managed Care – PPO | Attending: Emergency Medicine | Admitting: Emergency Medicine

## 2010-12-05 ENCOUNTER — Emergency Department (INDEPENDENT_AMBULATORY_CARE_PROVIDER_SITE_OTHER): Payer: BC Managed Care – PPO

## 2010-12-05 DIAGNOSIS — Z87891 Personal history of nicotine dependence: Secondary | ICD-10-CM

## 2010-12-05 DIAGNOSIS — J45901 Unspecified asthma with (acute) exacerbation: Secondary | ICD-10-CM | POA: Insufficient documentation

## 2010-12-05 DIAGNOSIS — R0602 Shortness of breath: Secondary | ICD-10-CM

## 2010-12-05 DIAGNOSIS — R05 Cough: Secondary | ICD-10-CM

## 2010-12-05 DIAGNOSIS — F17200 Nicotine dependence, unspecified, uncomplicated: Secondary | ICD-10-CM | POA: Insufficient documentation

## 2010-12-07 ENCOUNTER — Telehealth (INDEPENDENT_AMBULATORY_CARE_PROVIDER_SITE_OTHER): Payer: Self-pay | Admitting: *Deleted

## 2010-12-12 ENCOUNTER — Encounter: Payer: Self-pay | Admitting: Internal Medicine

## 2010-12-22 NOTE — Letter (Signed)
Summary: [facility]  [address]   [place], [place] [address]   Phone: [phone]  Fax: [phone]    12/12/2010  Timothy Chung [address] [place], [place]  [address]  [place]  Dear Mr. Hickok,  Our records indicate that you are due for followup with Dr Sandrea Hughs.  Please call out office at [phone] to schedule appointment. Thank You.         Sincerely,   [facility]

## 2010-12-22 NOTE — Progress Notes (Signed)
Summary: needs ov- letter mailed  ---- Converted from flag ---- ---- 12/07/2010 1:31 PM, Nyoka Cowden MD wrote: pt seen in er 3/12 so needs ov asap with all active meds in hand ------------------------------  [facility] Timothy Chung  December 07, 2010 5:19 PM  LMTCB x1 Timothy Chung  December 09, 2010 2:53 PM

## 2011-01-06 ENCOUNTER — Emergency Department (HOSPITAL_BASED_OUTPATIENT_CLINIC_OR_DEPARTMENT_OTHER)
Admission: EM | Admit: 2011-01-06 | Discharge: 2011-01-06 | Disposition: A | Discharge status: Home / Self Care | Payer: BC Managed Care – PPO | Attending: Emergency Medicine | Admitting: Emergency Medicine

## 2011-01-06 DIAGNOSIS — J45901 Unspecified asthma with (acute) exacerbation: Secondary | ICD-10-CM | POA: Insufficient documentation

## 2011-01-06 DIAGNOSIS — Z79899 Other long term (current) drug therapy: Secondary | ICD-10-CM | POA: Insufficient documentation

## 2011-01-06 DIAGNOSIS — E7800 Pure hypercholesterolemia, unspecified: Secondary | ICD-10-CM | POA: Insufficient documentation

## 2011-01-06 DIAGNOSIS — F17200 Nicotine dependence, unspecified, uncomplicated: Secondary | ICD-10-CM | POA: Insufficient documentation

## 2011-01-13 ENCOUNTER — Ambulatory Visit: Payer: Self-pay | Admitting: Family Medicine

## 2011-01-22 ENCOUNTER — Encounter: Payer: Self-pay | Admitting: Internal Medicine

## 2011-01-22 DIAGNOSIS — Z0000 Encounter for general adult medical examination without abnormal findings: Secondary | ICD-10-CM | POA: Insufficient documentation

## 2011-01-24 ENCOUNTER — Other Ambulatory Visit (INDEPENDENT_AMBULATORY_CARE_PROVIDER_SITE_OTHER): Payer: BC Managed Care – PPO

## 2011-01-24 ENCOUNTER — Encounter: Payer: Self-pay | Admitting: Internal Medicine

## 2011-01-24 ENCOUNTER — Ambulatory Visit (INDEPENDENT_AMBULATORY_CARE_PROVIDER_SITE_OTHER): Payer: BC Managed Care – PPO | Admitting: Internal Medicine

## 2011-01-24 ENCOUNTER — Other Ambulatory Visit (INDEPENDENT_AMBULATORY_CARE_PROVIDER_SITE_OTHER): Payer: BC Managed Care – PPO | Admitting: Internal Medicine

## 2011-01-24 VITALS — BP 138/82 | HR 108 | Temp 97.9°F | Ht 72.0 in | Wt 240.2 lb

## 2011-01-24 DIAGNOSIS — J309 Allergic rhinitis, unspecified: Secondary | ICD-10-CM

## 2011-01-24 DIAGNOSIS — E785 Hyperlipidemia, unspecified: Secondary | ICD-10-CM

## 2011-01-24 DIAGNOSIS — Z0000 Encounter for general adult medical examination without abnormal findings: Secondary | ICD-10-CM

## 2011-01-24 DIAGNOSIS — R972 Elevated prostate specific antigen [PSA]: Secondary | ICD-10-CM | POA: Insufficient documentation

## 2011-01-24 DIAGNOSIS — J4520 Mild intermittent asthma, uncomplicated: Secondary | ICD-10-CM

## 2011-01-24 DIAGNOSIS — Z13220 Encounter for screening for lipoid disorders: Secondary | ICD-10-CM

## 2011-01-24 HISTORY — DX: Elevated prostate specific antigen (PSA): R97.20

## 2011-01-24 HISTORY — DX: Mild intermittent asthma, uncomplicated: J45.20

## 2011-01-24 LAB — BASIC METABOLIC PANEL
BUN: 10 mg/dL (ref 6–23)
CO2: 31 mEq/L (ref 19–32)
Calcium: 9.5 mg/dL (ref 8.4–10.5)
Chloride: 106 mEq/L (ref 96–112)
Creatinine, Ser: 0.9 mg/dL (ref 0.4–1.5)
GFR: 118.61 mL/min (ref >60.00)
Glucose, Bld: 101 mg/dL — ABNORMAL HIGH (ref 70–99)
Potassium: 4.1 mEq/L (ref 3.5–5.1)
Sodium: 144 mEq/L (ref 135–145)

## 2011-01-24 LAB — CBC WITH DIFFERENTIAL/PLATELET
Basophils Absolute: 0 10*3/uL (ref 0.0–0.1)
Basophils Relative: 0.4 % (ref 0.0–3.0)
Eosinophils Absolute: 0.9 10*3/uL — ABNORMAL HIGH (ref 0.0–0.7)
Eosinophils Relative: 11 % — ABNORMAL HIGH (ref 0.0–5.0)
HCT: 45.1 % (ref 39.0–52.0)
Hemoglobin: 15.5 g/dL (ref 13.0–17.0)
Lymphocytes Relative: 18.1 % (ref 12.0–46.0)
Lymphs Abs: 1.4 10*3/uL (ref 0.7–4.0)
MCHC: 34.4 g/dL (ref 30.0–36.0)
MCV: 91.9 fl (ref 78.0–100.0)
Monocytes Absolute: 0.5 10*3/uL (ref 0.1–1.0)
Monocytes Relative: 6.9 % (ref 3.0–12.0)
Neutro Abs: 5 10*3/uL (ref 1.4–7.7)
Neutrophils Relative %: 63.6 % (ref 43.0–77.0)
Platelets: 190 10*3/uL (ref 150.0–400.0)
RBC: 4.91 Mil/uL (ref 4.22–5.81)
RDW: 13.7 % (ref 11.5–14.6)
WBC: 7.9 10*3/uL (ref 4.5–10.5)

## 2011-01-24 LAB — LIPID PANEL
Cholesterol: 223 mg/dL — ABNORMAL HIGH (ref 0–200)
HDL: 35.9 mg/dL — ABNORMAL LOW (ref >39.00)
Total CHOL/HDL Ratio: 6
Triglycerides: 230 mg/dL — ABNORMAL HIGH (ref 0.0–149.0)
VLDL: 46 mg/dL — ABNORMAL HIGH (ref 0.0–40.0)

## 2011-01-24 LAB — URINALYSIS, ROUTINE W REFLEX MICROSCOPIC
Bilirubin Urine: NEGATIVE
Hgb urine dipstick: NEGATIVE
Ketones, ur: NEGATIVE
Leukocytes, UA: NEGATIVE
Nitrite: NEGATIVE
Specific Gravity, Urine: 1.025 (ref 1.000–1.030)
Total Protein, Urine: NEGATIVE
Urine Glucose: NEGATIVE
Urobilinogen, UA: 0.2 (ref 0.0–1.0)
pH: 6 (ref 5.0–8.0)

## 2011-01-24 LAB — HEPATIC FUNCTION PANEL
ALT: 23 U/L (ref 0–53)
AST: 17 U/L (ref 0–37)
Albumin: 3.7 g/dL (ref 3.5–5.2)
Alkaline Phosphatase: 98 U/L (ref 39–117)
Bilirubin, Direct: 0.1 mg/dL (ref 0.0–0.3)
Total Bilirubin: 1.1 mg/dL (ref 0.3–1.2)
Total Protein: 7.3 g/dL (ref 6.0–8.3)

## 2011-01-24 LAB — PSA: PSA: 1.84 ng/mL (ref 0.10–4.00)

## 2011-01-24 LAB — TSH: TSH: 0.79 u[IU]/mL (ref 0.35–5.50)

## 2011-01-24 LAB — LDL CHOLESTEROL, DIRECT: Direct LDL: 158.3 mg/dL

## 2011-01-24 MED ORDER — ATORVASTATIN CALCIUM 20 MG PO TABS: 20.0000 mg | ORAL_TABLET | Freq: Every day | ORAL | Status: DC

## 2011-01-24 MED ORDER — METHYLPREDNISOLONE ACETATE 80 MG/ML IJ SUSP
120.0000 mg | Freq: Once | INTRAMUSCULAR | Status: AC
  Administered 2011-01-24: 120 mg via INTRAMUSCULAR

## 2011-01-24 MED ORDER — MOMETASONE FUROATE 50 MCG/ACT NA SUSP: 2.0000 | Freq: Every day | NASAL | Status: DC

## 2011-01-24 NOTE — Assessment & Plan Note (Signed)
stable overall by hx and exam, most recent lab reviewed with pt, and pt to continue medical treatment as before except LDL too high - to stop the pravachol, start the lipitor 20 mg, f/u labs 4 wks

## 2011-01-24 NOTE — Assessment & Plan Note (Signed)
psa found to be elevated - for urology referral - r/o prostate ca

## 2011-01-24 NOTE — Assessment & Plan Note (Signed)

## 2011-01-24 NOTE — Assessment & Plan Note (Signed)
Mod flare in the past few wks;  Encouraged allergen avoidance, Continue all other medications as before, for depomedrol IM today, and add nasonex asd

## 2011-01-24 NOTE — Patient Instructions (Signed)
You had the steroid shot today Take all new medications as prescribed - the nasonex Continue all other medications as before Please go to LAB in the Basement for the blood and/or urine tests to be done today Please call the number on the Blue Card (the [employer] System) for results of testing in 2-3 days Please return in 1 year for your yearly visit, or sooner if needed, with Lab testing done 3-5 days before

## 2011-01-24 NOTE — Progress Notes (Signed)
Subjective:    Patient ID: Timothy Chung, male    DOB: 1961/02/20, 50 y.o.   MRN: [id number]  HPI here to establish as new pt, transfer from [place] as he lives in [place];  Also sees Dr Ocie Doyne;  Here for wellness and f/u;  Overall doing ok;  Pt denies CP, worsening SOB, DOE, wheezing, orthopnea, PND, worsening LE edema, palpitations, dizziness or syncope.  Pt denies neurological change such as new Headache, facial or extremity weakness.  Pt denies polydipsia, polyuria, or low sugar symptoms. Pt states overall good compliance with treatment and medications, good tolerability, and trying to follow lower cholesterol diet.  Pt denies worsening depressive symptoms, suicidal ideation or panic. No fever, wt loss, night sweats, loss of appetite, or other constitutional symptoms.  Pt states good ability with ADL's, low fall risk, home safety reviewed and adequate, no significant changes in hearing or vision, and occasionally active with exercise.  Does have some ongoing nasal congestion with nasal drip, worse to li down to sleep, has cough and wheezing;  Saw allergist  - has allergies to mold, pollen and dust.  Had good PFT's recnetly, but had another attack 2 wks later.   Pt denies fever, wt loss, night sweats, loss of appetite, or other constitutional symptoms  Overall good compliance with treatment, and good medicine tolerability.   Past Medical History  Diagnosis Date  . Allergy   . Positive TB test    No past surgical history on file.  reports that he has quit smoking. He does not have any smokeless tobacco history on file. He reports that he drinks alcohol. He reports that he does not use illicit drugs. family history includes Diabetes in his father and mother and Hypertension in his father and mother. Allergies  Allergen Reactions  . Penicillins    No current outpatient prescriptions on file prior to visit.   Review of Systems Review of Systems  Constitutional: Negative for diaphoresis,  activity change, appetite change and unexpected weight change.  HENT: Negative for hearing loss, ear pain, facial swelling, mouth sores and neck stiffness.   Eyes: Negative for pain, redness and visual disturbance.  Respiratory: Negative for shortness of breath and wheezing.   Cardiovascular: Negative for chest pain and palpitations.  Gastrointestinal: Negative for diarrhea, blood in stool, abdominal distention and rectal pain.  Genitourinary: Negative for hematuria, flank pain and decreased urine volume.  Musculoskeletal: Negative for myalgias and joint swelling.  Skin: Negative for color change and wound.  Neurological: Negative for syncope and numbness.  Hematological: Negative for adenopathy.  Psychiatric/Behavioral: Negative for hallucinations, self-injury, decreased concentration and agitation.      Objective:   Physical Exam BP 138/82  Pulse 108  Temp(Src) 97.9 F (36.6 C) (Oral)  Ht 6' (1.829 m)  Wt 240 lb 4 oz (108.977 kg)  BMI 32.58 kg/m2  SpO2 92% Physical Exam  VS noted Constitutional: Pt is oriented to person, place, and time. Appears well-developed and well-nourished.  HENT:  Head: Normocephalic and atraumatic.  Right Ear: External ear normal.  Left Ear: External ear normal.  Nose: Nose normal.  Mouth/Throat: Oropharynx is clear and moist.  Eyes: Conjunctivae and EOM are normal. Pupils are equal, round, and reactive to light.  Neck: Normal range of motion. Neck supple. No JVD present. No tracheal deviation present.  Cardiovascular: Normal rate, regular rhythm, normal heart sounds and intact distal pulses.   Pulmonary/Chest: Effort normal and breath sounds normal.  Abdominal: Soft. Bowel sounds are normal. There is  no tenderness.  Musculoskeletal: Normal range of motion. Exhibits no edema.  Lymphadenopathy:  Has no cervical adenopathy.  Neurological: Pt is alert and oriented to person, place, and time. Pt has normal reflexes. No cranial nerve deficit.  Skin: Skin  is warm and dry. No rash noted.  Psychiatric:  Has  normal mood and affect. Behavior is normal.         Assessment & Plan:

## 2011-02-21 ENCOUNTER — Other Ambulatory Visit (INDEPENDENT_AMBULATORY_CARE_PROVIDER_SITE_OTHER): Payer: BC Managed Care – PPO

## 2011-02-21 ENCOUNTER — Other Ambulatory Visit: Payer: Self-pay | Admitting: Internal Medicine

## 2011-02-21 DIAGNOSIS — Z0000 Encounter for general adult medical examination without abnormal findings: Secondary | ICD-10-CM

## 2011-02-21 LAB — CBC WITH DIFFERENTIAL/PLATELET
Basophils Relative: 0 % (ref 0.0–3.0)
Eosinophils Relative: 2.9 % (ref 0.0–5.0)
Hemoglobin: 15.5 g/dL (ref 13.0–17.0)
MCV: 91.2 fl (ref 78.0–100.0)
Monocytes Absolute: 0.3 10*3/uL (ref 0.1–1.0)
Neutro Abs: 6.2 10*3/uL (ref 1.4–7.7)
Neutrophils Relative %: 76 % (ref 43.0–77.0)
RBC: 4.81 Mil/uL (ref 4.22–5.81)
WBC: 8.2 10*3/uL (ref 4.5–10.5)

## 2011-02-21 LAB — URINALYSIS, ROUTINE W REFLEX MICROSCOPIC
Bilirubin Urine: NEGATIVE
Hgb urine dipstick: NEGATIVE
Ketones, ur: NEGATIVE
Leukocytes, UA: NEGATIVE
Nitrite: NEGATIVE
Urobilinogen, UA: 0.2 (ref 0.0–1.0)
pH: 5.5 (ref 5.0–8.0)

## 2011-02-21 LAB — BASIC METABOLIC PANEL
BUN: 16 mg/dL (ref 6–23)
Creatinine, Ser: 0.8 mg/dL (ref 0.4–1.5)
GFR: 125.11 mL/min (ref >60.00)

## 2011-02-21 LAB — PSA: PSA: 1.17 ng/mL (ref 0.10–4.00)

## 2011-02-21 LAB — HEPATIC FUNCTION PANEL
ALT: 29 U/L (ref 0–53)
Albumin: 3.9 g/dL (ref 3.5–5.2)
Alkaline Phosphatase: 84 U/L (ref 39–117)
Bilirubin, Direct: 0.2 mg/dL (ref 0.0–0.3)
Total Protein: 7.2 g/dL (ref 6.0–8.3)

## 2011-02-21 LAB — LIPID PANEL: Cholesterol: 149 mg/dL (ref 0–200)

## 2011-02-21 NOTE — Progress Notes (Signed)
Quick Note:  Voice message left on [employer] system - lab is negative, normal or otherwise stable, pt to continue same tx ______ 

## 2011-02-23 ENCOUNTER — Ambulatory Visit: Payer: BC Managed Care – PPO | Admitting: Internal Medicine

## 2012-01-29 ENCOUNTER — Other Ambulatory Visit: Payer: Self-pay | Admitting: Internal Medicine

## 2012-04-27 IMAGING — CR DG CHEST 2V
2 series · 2 of 2 positions shown · non-contrast
Comparison: 04/26/2005.

CLINICAL DATA: Shortness of breath.  Congestion.  Coughing.
Allergies.  Recent cessation of smoking.

CHEST - 2 VIEW

[w chest pa]
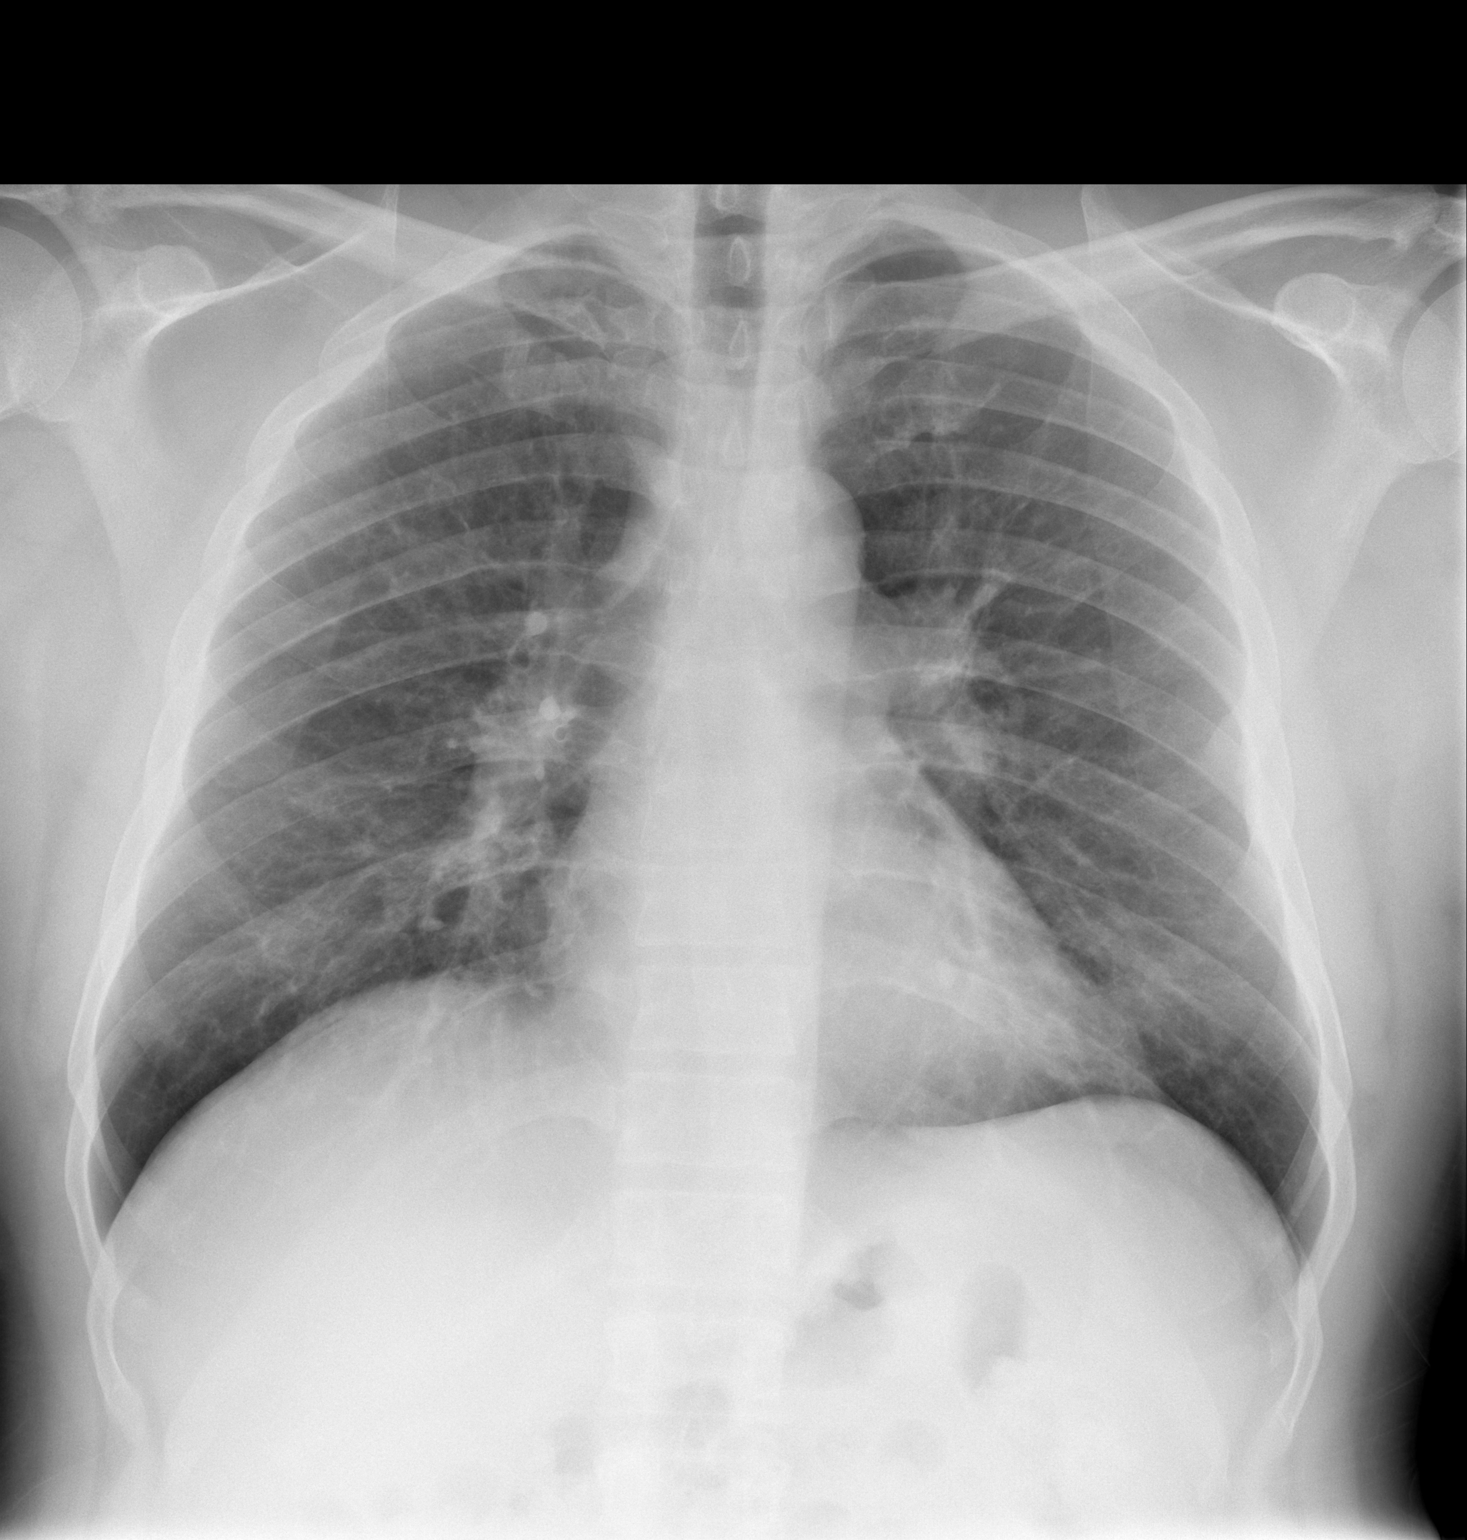

[w chest lat]
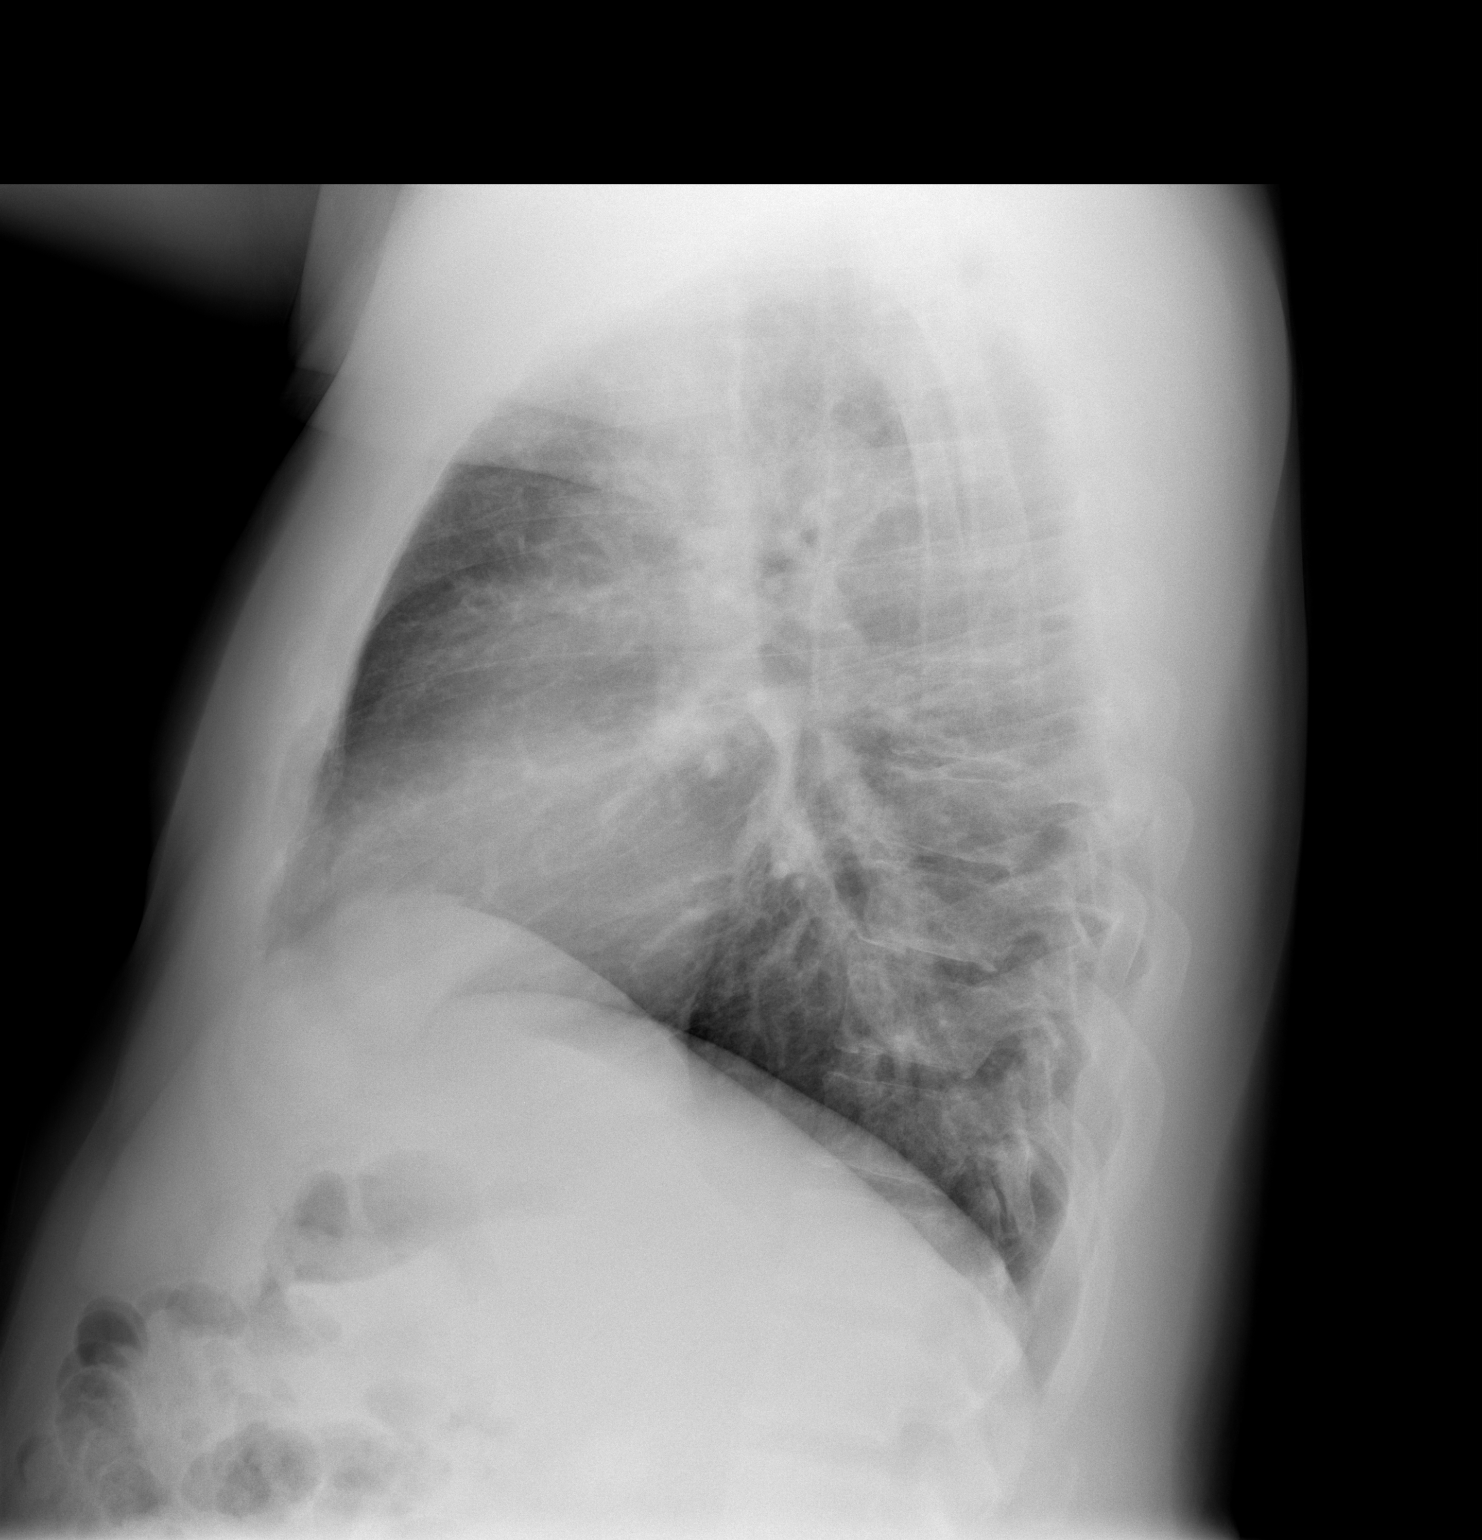

[2 of 2 positions shown; findings below may reference images not displayed]

FINDINGS: The cardiac silhouette is normal size and shape. No
pleural abnormality is evident.  There is a rounded prominence of
the left hilum which appears more prominent than on previous study.
There is slight increase in the perihilar markings on the lateral
image with minimal central peribronchial thickening.  There is
slight flattening of the diaphragm on lateral image suggesting
minimal hyperinflation is present.  No skeletal lesion is seen.
IMPRESSION: There is a rounded prominence of the left hilum which appears more
prominent than on previous study.  This may be vascular but I
cannot exclude enlarged lymph node.  There is increase in the
perihilar markings on the lateral image with central peribronchial
thickening.  This most commonly is associated with bronchitis,
asthma and reactive airway disease, or smoking.  No consolidation
is evident.  Suggest radiographic follow-up after therapy.

## 2013-04-25 ENCOUNTER — Ambulatory Visit (INDEPENDENT_AMBULATORY_CARE_PROVIDER_SITE_OTHER): Payer: BC Managed Care – PPO | Admitting: General Surgery

## 2013-04-25 ENCOUNTER — Encounter (HOSPITAL_COMMUNITY): Payer: Self-pay | Admitting: Pharmacy Technician

## 2013-04-25 ENCOUNTER — Encounter (INDEPENDENT_AMBULATORY_CARE_PROVIDER_SITE_OTHER): Payer: Self-pay | Admitting: General Surgery

## 2013-04-25 VITALS — BP 130/90 | HR 108 | Temp 98.0°F | Resp 18 | Ht 72.0 in | Wt 238.0 lb

## 2013-04-25 DIAGNOSIS — K810 Acute cholecystitis: Secondary | ICD-10-CM

## 2013-04-25 NOTE — Progress Notes (Signed)
Patient ID: Timothy Chung, male   DOB: 04/23/1961, 51 y.o.   MRN: [id number]  Chief Complaint  Patient presents with  . New Evaluation    G.B    HPI Timothy Chung is a 51 y.o. male.  This patient is referred by Dr. Bouska for evaluation of symptomatic cholelithiasis. He recently awoke from sleep at about 3:00 in the morning feeling as though he had indigestion after eating [place] food.  He takes Prilosec about every other day for reflux but this has not improved his symptoms. He had some nausea as well as but denies any fevers or chills. He has had some constipation he took some magnesium citrate and has moved his bowels but this has not improved his symptoms. He describes his pain as right upper quadrant under history of what she describes as a constant and dull pain which radiates to his back. He says he feels much better than he did initially but he still has some mild discomfort in the area. He has been eating fruits without difficulty since then. He saw his primary care physician and he ordered some laboratory studies which demonstrated normal white blood cell count and a bilirubin of 2.0 and an amylase of 32. He also ordered an ultrasound which demonstrated and nonmobile gallstones at the neck of the gallbladder with edematous wall concerning for acute cholecystitis. Also on ultrasound demonstrated a solid left renal mass concerning for renal cell carcinoma. The patient tells me that his doctor has told him that he is sitting him up with the urology referral for further evaluation of this.  HPI  Past Medical History  Diagnosis Date  . Allergy   . Positive TB test   . Asthma, intermittent 01/24/2011  . Increased prostate specific antigen (PSA) velocity 01/24/2011    No past surgical history on file.  Family History  Problem Relation Age of Onset  . Hypertension Mother   . Diabetes Mother   . Hypertension Father   . Diabetes Father     Social History History  Substance Use Topics   . Smoking status: Former Smoker  . Smokeless tobacco: Not on file     Comment: quit 09/24/2010  . Alcohol Use: Yes     Comment: Drinks a beer occasionally    Allergies  Allergen Reactions  . Ibuprofen Other (See Comments)    wheezing  . Penicillins     Current Outpatient Prescriptions  Medication Sig Dispense Refill  . albuterol (VENTOLIN HFA) 108 (90 BASE) MCG/ACT inhaler Inhale 2 puffs into the lungs 2 (two) times daily.        . aspirin 81 MG EC tablet Take 81 mg by mouth daily.        . atorvastatin (LIPITOR) 20 MG tablet TAKE 1 TABLET BY MOUTH EVERY DAY  30 tablet  1  . HYDROcodone-acetaminophen (NORCO) 7.5-325 MG per tablet       . Mometasone Furo-Formoterol Fum (DULERA) 100-5 MCG/ACT AERO Inhale 2 puffs into the lungs 2 (two) times daily.        . montelukast (SINGULAIR) 10 MG tablet       . omeprazole (PRILOSEC) 20 MG capsule Take 20 mg by mouth daily.        . cetirizine (ZYRTEC) 10 MG tablet Take 10 mg by mouth 2 (two) times daily.        . Cholecalciferol (VITAMIN D3) 1000 UNITS CAPS Take by mouth daily.        . mometasone (NASONEX) 50   MCG/ACT nasal spray 2 sprays by Nasal route daily.  17 g  2   No current facility-administered medications for this visit.    Review of Systems Review of Systems All other review of systems negative or noncontributory except as stated in the HPI  Blood pressure 130/90, pulse 108, temperature 98 F (36.7 C), resp. rate 18, height 6' (1.829 m), weight 238 lb (107.956 kg).  Physical Exam Physical Exam Physical Exam  Vitals reviewed. Constitutional: He is oriented to person, place, and time. He appears well-developed and well-nourished. No distress.  HENT:  Head: Normocephalic and atraumatic.  Mouth/Throat: No oropharyngeal exudate.  Eyes: Conjunctivae and EOM are normal. Pupils are equal, round, and reactive to light. Right eye exhibits no discharge. Left eye exhibits no discharge. No scleral icterus.  Neck: Normal range of  motion. No tracheal deviation present.  Cardiovascular: Normal rate, regular rhythm and normal heart sounds.   Pulmonary/Chest: Effort normal and breath sounds normal. No stridor. No respiratory distress. He has no wheezes. He has no rales. He exhibits no tenderness.  Abdominal: Soft. Bowel sounds are normal. He exhibits no distension and no mass. There is very mild  Tenderness in the RUQ but no Murphy's sign and no peritonitis. There is no rebound and no guarding.  Musculoskeletal: Normal range of motion. He exhibits no edema and no tenderness.  Neurological: He is alert and oriented to person, place, and time.  Skin: Skin is warm and dry. No rash noted. He is not diaphoretic. No erythema. No pallor.  Psychiatric: He has a normal mood and affect. His behavior is normal. Judgment and thought content normal.  Data Reviewed [place] and labs and clinic notes  Assessment    Cholelithiasis and concern for acute cholecystitis He does have gallstones on ultrasound as well as some wall thickening and some persistent right upper quadrant pain concerning for acute cholecystitis. I discussed with him the options for admission to the hospital with early cholecystectomy and I have recommended this option given his persistent pain and ultrasound findings. However, he did not want to pursue this option because he is concerned for his work schedule and coordinating any possible procedure with his work. Since he is declining urgent surgery now, I will put him on the schedule for as early as I can't next week and hold for now we'll give him time to workout his issues with his employer. I discussed with him the pros and cons of these options and explained that if this is acute cholecystitis at his infection could worsen.  I explained that if his symptoms increase or develops any fevers or chills that he should go to the emergency room as soon as possible for urgent treatment. I also discussed with them the plan procedure and  the risks and benefits of this. The risks of infection, bleeding, pain, persistent symptoms, scarring, injury to bowel or bile ducts, retained stone, diarrhea, need for additional procedures, and need for open surgery discussed with the patient.  I will go ahead and put him on the schedule for cholecystectomy next week but if he develops any increasing pain or fevers and he should call spectacle to the emergency room as soon as possible for cholecystectomy     Plan           Myrick Mcnairy DAVID 04/25/2013, 10:10 AM    

## 2013-04-28 ENCOUNTER — Encounter (HOSPITAL_COMMUNITY)
Admission: RE | Admit: 2013-04-28 | Discharge: 2013-04-28 | Disposition: A | Discharge status: Home / Self Care | Payer: BC Managed Care – PPO | Source: Ambulatory Visit | Attending: General Surgery | Admitting: General Surgery

## 2013-04-28 ENCOUNTER — Encounter (HOSPITAL_COMMUNITY): Payer: Self-pay

## 2013-04-28 VITALS — BP 121/69 | HR 110 | Temp 98.9°F | Resp 16 | Ht 72.0 in | Wt 235.0 lb

## 2013-04-28 DIAGNOSIS — K810 Acute cholecystitis: Secondary | ICD-10-CM

## 2013-04-28 HISTORY — DX: Gastro-esophageal reflux disease without esophagitis: K21.9

## 2013-04-28 HISTORY — DX: Pure hypercholesterolemia, unspecified: E78.00

## 2013-04-28 LAB — CBC WITH DIFFERENTIAL/PLATELET
Eosinophils Relative: 5 % (ref 0–5)
Hemoglobin: 13.8 g/dL (ref 13.0–17.0)
Lymphocytes Relative: 17 % (ref 12–46)
Lymphs Abs: 1.5 10*3/uL (ref 0.7–4.0)
MCV: 89.8 fL (ref 78.0–100.0)
Monocytes Relative: 10 % (ref 3–12)
Neutrophils Relative %: 67 % (ref 43–77)
Platelets: 285 10*3/uL (ref 150–400)
RBC: 4.52 MIL/uL (ref 4.22–5.81)
WBC: 8.6 10*3/uL (ref 4.0–10.5)

## 2013-04-28 LAB — COMPREHENSIVE METABOLIC PANEL
ALT: 30 U/L (ref 0–53)
Alkaline Phosphatase: 86 U/L (ref 39–117)
BUN: 9 mg/dL (ref 6–23)
CO2: 29 mEq/L (ref 19–32)
GFR calc Af Amer: >90 mL/min (ref >90)
GFR calc non Af Amer: >90 mL/min (ref >90)
Glucose, Bld: 104 mg/dL — ABNORMAL HIGH (ref 70–99)
Potassium: 3.9 mEq/L (ref 3.5–5.1)
Sodium: 135 mEq/L (ref 135–145)

## 2013-04-28 NOTE — Patient Instructions (Addendum)
20 KYNDAL GLOSTER  04/28/2013   Your procedure is scheduled on: 04/29/13  Report to [facility] at 12:15 PM.  Call this number if you have problems the morning of surgery 336-: [phone]   Remember: please bring inhaler on the day of surgery    Do not eat food After Midnight, clear liquids from midnight until 8:45 am on 04/29/13 then nothing.      Take these medicines the morning of surgery with A SIP OF WATER: hydrocodone if needed, claritin, omeprazole if needed, inhaler if needed   Do not wear jewelry, make-up or nail polish.  Do not wear lotions, powders, or perfumes. You may wear deodorant.  Do not shave 48 hours prior to surgery. Men may shave face and neck.  Do not bring valuables to the hospital.  Contacts, dentures or bridgework may not be worn into surgery.     Patients discharged the day of surgery will not be allowed to drive home.  Name and phone number of your driver: Arnaldo Natal [phone]    Birdie Sons, RN  pre op nurse call if needed [phone]    FAILURE TO FOLLOW THESE INSTRUCTIONS MAY RESULT IN CANCELLATION OF YOUR SURGERY   Patient Signature: ___________________________________________

## 2013-04-29 ENCOUNTER — Encounter (HOSPITAL_COMMUNITY): Payer: Self-pay | Admitting: Anesthesiology

## 2013-04-29 ENCOUNTER — Ambulatory Visit (HOSPITAL_COMMUNITY): Payer: BC Managed Care – PPO

## 2013-04-29 ENCOUNTER — Ambulatory Visit (HOSPITAL_COMMUNITY): Payer: BC Managed Care – PPO | Admitting: Anesthesiology

## 2013-04-29 ENCOUNTER — Inpatient Hospital Stay (HOSPITAL_COMMUNITY)
Admission: RE | Admit: 2013-04-29 | Discharge: 2013-05-01 | DRG: 493 | Disposition: A | Discharge status: Home / Self Care | Payer: BC Managed Care – PPO | Source: Ambulatory Visit | Attending: General Surgery | Admitting: General Surgery

## 2013-04-29 ENCOUNTER — Encounter (HOSPITAL_COMMUNITY)
Admission: RE | Disposition: A | Discharge status: Home / Self Care | Payer: Self-pay | Source: Ambulatory Visit | Attending: General Surgery

## 2013-04-29 ENCOUNTER — Encounter (HOSPITAL_COMMUNITY): Payer: Self-pay | Admitting: *Deleted

## 2013-04-29 DIAGNOSIS — Q4479 Other congenital malformations of liver: Secondary | ICD-10-CM

## 2013-04-29 DIAGNOSIS — Q445 Other congenital malformations of bile ducts: Secondary | ICD-10-CM

## 2013-04-29 DIAGNOSIS — J4489 Other specified chronic obstructive pulmonary disease: Secondary | ICD-10-CM | POA: Diagnosis present

## 2013-04-29 DIAGNOSIS — K810 Acute cholecystitis: Secondary | ICD-10-CM

## 2013-04-29 DIAGNOSIS — Q441 Other congenital malformations of gallbladder: Secondary | ICD-10-CM

## 2013-04-29 DIAGNOSIS — K219 Gastro-esophageal reflux disease without esophagitis: Secondary | ICD-10-CM | POA: Diagnosis present

## 2013-04-29 DIAGNOSIS — K8066 Calculus of gallbladder and bile duct with acute and chronic cholecystitis without obstruction: Secondary | ICD-10-CM

## 2013-04-29 DIAGNOSIS — J449 Chronic obstructive pulmonary disease, unspecified: Secondary | ICD-10-CM | POA: Diagnosis present

## 2013-04-29 DIAGNOSIS — K8000 Calculus of gallbladder with acute cholecystitis without obstruction: Principal | ICD-10-CM | POA: Diagnosis present

## 2013-04-29 HISTORY — PX: CHOLECYSTECTOMY: SHX55

## 2013-04-29 SURGERY — LAPAROSCOPIC CHOLECYSTECTOMY WITH INTRAOPERATIVE CHOLANGIOGRAM
Anesthesia: General | Site: Abdomen | Wound class: Contaminated

## 2013-04-29 MED ORDER — LACTATED RINGERS IV SOLN
INTRAVENOUS | Status: DC
  Administered 2013-04-29: 16:00:00 via INTRAVENOUS
  Administered 2013-04-29: 1000 mL via INTRAVENOUS

## 2013-04-29 MED ORDER — PANTOPRAZOLE SODIUM 40 MG PO TBEC
40.0000 mg | DELAYED_RELEASE_TABLET | Freq: Every day | ORAL | Status: DC
  Administered 2013-04-30: 40 mg via ORAL
  Filled 2013-04-29 (×2): qty 1

## 2013-04-29 MED ORDER — LIDOCAINE-EPINEPHRINE (PF) 1 %-1:200000 IJ SOLN
INTRAMUSCULAR | Status: AC
  Filled 2013-04-29: qty 10

## 2013-04-29 MED ORDER — SUCCINYLCHOLINE CHLORIDE 20 MG/ML IJ SOLN
INTRAMUSCULAR | Status: DC | PRN
  Administered 2013-04-29: 100 mg via INTRAVENOUS

## 2013-04-29 MED ORDER — CIPROFLOXACIN IN D5W 400 MG/200ML IV SOLN
400.0000 mg | Freq: Two times a day (BID) | INTRAVENOUS | Status: DC
  Administered 2013-04-30 – 2013-05-01 (×3): 400 mg via INTRAVENOUS
  Filled 2013-04-29 (×4): qty 200

## 2013-04-29 MED ORDER — ONDANSETRON HCL 4 MG PO TABS: 4.0000 mg | ORAL_TABLET | Freq: Four times a day (QID) | ORAL | Status: DC | PRN

## 2013-04-29 MED ORDER — LIDOCAINE-EPINEPHRINE (PF) 1 %-1:200000 IJ SOLN
INTRAMUSCULAR | Status: DC | PRN
  Administered 2013-04-29: 30 mL

## 2013-04-29 MED ORDER — LACTATED RINGERS IR SOLN
Status: DC | PRN
  Administered 2013-04-29: 2000 mL

## 2013-04-29 MED ORDER — MOMETASONE FURO-FORMOTEROL FUM 100-5 MCG/ACT IN AERO: 2.0000 | INHALATION_SPRAY | Freq: Two times a day (BID) | RESPIRATORY_TRACT | Status: DC | PRN

## 2013-04-29 MED ORDER — MORPHINE SULFATE 2 MG/ML IJ SOLN
1.0000 mg | INTRAMUSCULAR | Status: DC | PRN
  Administered 2013-04-29: 2 mg via INTRAVENOUS
  Administered 2013-04-30 (×3): 4 mg via INTRAVENOUS
  Administered 2013-05-01: 2 mg via INTRAVENOUS
  Filled 2013-04-29: qty 1
  Filled 2013-04-29 (×3): qty 2
  Filled 2013-04-29: qty 1

## 2013-04-29 MED ORDER — BUPIVACAINE HCL (PF) 0.25 % IJ SOLN
INTRAMUSCULAR | Status: DC | PRN
  Administered 2013-04-29: 30 mL

## 2013-04-29 MED ORDER — PROPOFOL 10 MG/ML IV BOLUS
INTRAVENOUS | Status: DC | PRN
  Administered 2013-04-29: 200 mg via INTRAVENOUS

## 2013-04-29 MED ORDER — CIPROFLOXACIN IN D5W 400 MG/200ML IV SOLN
INTRAVENOUS | Status: AC
  Filled 2013-04-29: qty 200

## 2013-04-29 MED ORDER — HYDROCODONE-ACETAMINOPHEN 7.5-325 MG PO TABS
1.0000 | ORAL_TABLET | ORAL | Status: DC | PRN
  Administered 2013-04-30 – 2013-05-01 (×4): 1 via ORAL
  Filled 2013-04-29 (×4): qty 1

## 2013-04-29 MED ORDER — BUPIVACAINE HCL (PF) 0.25 % IJ SOLN
INTRAMUSCULAR | Status: AC
  Filled 2013-04-29: qty 30

## 2013-04-29 MED ORDER — HYDROMORPHONE HCL PF 1 MG/ML IJ SOLN
INTRAMUSCULAR | Status: DC | PRN
  Administered 2013-04-29 (×2): 1 mg via INTRAVENOUS

## 2013-04-29 MED ORDER — MIDAZOLAM HCL 5 MG/5ML IJ SOLN
INTRAMUSCULAR | Status: DC | PRN
  Administered 2013-04-29: 2 mg via INTRAVENOUS

## 2013-04-29 MED ORDER — IOHEXOL 300 MG/ML  SOLN
INTRAMUSCULAR | Status: AC
  Filled 2013-04-29: qty 1

## 2013-04-29 MED ORDER — MONTELUKAST SODIUM 10 MG PO TABS
10.0000 mg | ORAL_TABLET | Freq: Every day | ORAL | Status: DC
  Administered 2013-04-30: 10 mg via ORAL
  Filled 2013-04-29 (×2): qty 1

## 2013-04-29 MED ORDER — DEXAMETHASONE SODIUM PHOSPHATE 10 MG/ML IJ SOLN
INTRAMUSCULAR | Status: DC | PRN
  Administered 2013-04-29: 10 mg via INTRAVENOUS

## 2013-04-29 MED ORDER — LABETALOL HCL 5 MG/ML IV SOLN
INTRAVENOUS | Status: DC | PRN
  Administered 2013-04-29: 5 mg via INTRAVENOUS

## 2013-04-29 MED ORDER — METOCLOPRAMIDE HCL 5 MG/ML IJ SOLN
INTRAMUSCULAR | Status: DC | PRN
  Administered 2013-04-29: 10 mg via INTRAVENOUS

## 2013-04-29 MED ORDER — NEOSTIGMINE METHYLSULFATE 1 MG/ML IJ SOLN
INTRAMUSCULAR | Status: DC | PRN
  Administered 2013-04-29: 5 mg via INTRAVENOUS

## 2013-04-29 MED ORDER — ONDANSETRON HCL 4 MG/2ML IJ SOLN: 4.0000 mg | Freq: Four times a day (QID) | INTRAMUSCULAR | Status: DC | PRN

## 2013-04-29 MED ORDER — ENOXAPARIN SODIUM 40 MG/0.4ML ~~LOC~~ SOLN
40.0000 mg | SUBCUTANEOUS | Status: DC
  Administered 2013-04-30: 40 mg via SUBCUTANEOUS
  Filled 2013-04-29 (×2): qty 0.4

## 2013-04-29 MED ORDER — ONDANSETRON HCL 4 MG/2ML IJ SOLN
INTRAMUSCULAR | Status: DC | PRN
  Administered 2013-04-29: 4 mg via INTRAVENOUS

## 2013-04-29 MED ORDER — LACTATED RINGERS IV SOLN
INTRAVENOUS | Status: DC
  Administered 2013-04-30 (×2): via INTRAVENOUS
  Administered 2013-04-30: 125 mL/h via INTRAVENOUS
  Administered 2013-05-01: 02:00:00 via INTRAVENOUS

## 2013-04-29 MED ORDER — ALBUTEROL SULFATE HFA 108 (90 BASE) MCG/ACT IN AERS
2.0000 | INHALATION_SPRAY | RESPIRATORY_TRACT | Status: DC | PRN
  Filled 2013-04-29: qty 6.7

## 2013-04-29 MED ORDER — ROCURONIUM BROMIDE 100 MG/10ML IV SOLN: INTRAVENOUS | Status: DC | PRN

## 2013-04-29 MED ORDER — SODIUM CHLORIDE 0.9 % IR SOLN
Status: DC | PRN
  Administered 2013-04-29: 1000 mL

## 2013-04-29 MED ORDER — LIDOCAINE HCL (PF) 2 % IJ SOLN
INTRAMUSCULAR | Status: DC | PRN
  Administered 2013-04-29: 100 mg

## 2013-04-29 MED ORDER — GLYCOPYRROLATE 0.2 MG/ML IJ SOLN
INTRAMUSCULAR | Status: DC | PRN
  Administered 2013-04-29: 0.6 mg via INTRAVENOUS

## 2013-04-29 MED ORDER — FENTANYL CITRATE 0.05 MG/ML IJ SOLN
INTRAMUSCULAR | Status: DC | PRN
  Administered 2013-04-29 (×5): 50 ug via INTRAVENOUS

## 2013-04-29 MED ORDER — ATORVASTATIN CALCIUM 20 MG PO TABS
20.0000 mg | ORAL_TABLET | Freq: Every day | ORAL | Status: DC
  Administered 2013-04-30: 20 mg via ORAL
  Filled 2013-04-29 (×2): qty 1

## 2013-04-29 MED ORDER — PHENYLEPHRINE HCL 10 MG/ML IJ SOLN
INTRAMUSCULAR | Status: DC | PRN
  Administered 2013-04-29 (×2): 80 ug via INTRAVENOUS

## 2013-04-29 MED ORDER — ROCURONIUM BROMIDE 100 MG/10ML IV SOLN
INTRAVENOUS | Status: DC | PRN
  Administered 2013-04-29: 30 mg via INTRAVENOUS
  Administered 2013-04-29 (×2): 10 mg via INTRAVENOUS

## 2013-04-29 MED ORDER — CIPROFLOXACIN IN D5W 400 MG/200ML IV SOLN
400.0000 mg | INTRAVENOUS | Status: AC
  Administered 2013-04-29: 400 mg via INTRAVENOUS

## 2013-04-29 SURGICAL SUPPLY — 48 items
ADH SKN CLS APL DERMABOND .7 (GAUZE/BANDAGES/DRESSINGS) ×1
APPLICATOR COTTON TIP 6IN STRL (MISCELLANEOUS) IMPLANT
APPLIER CLIP LOGIC TI 5 (MISCELLANEOUS) ×2 IMPLANT
APPLIER CLIP ROT 10 11.4 M/L (STAPLE) ×2
APR CLP MED LRG 11.4X10 (STAPLE) ×1
APR CLP MED LRG 33X5 (MISCELLANEOUS) ×1
BAG SPEC RTRVL LRG 6X4 10 (ENDOMECHANICALS) ×2
CABLE HIGH FREQUENCY MONO STRZ (ELECTRODE) ×2 IMPLANT
CANISTER SUCTION 2500CC (MISCELLANEOUS) ×2 IMPLANT
CATH REDDICK CHOLANGI 4FR 50CM (CATHETERS) ×2 IMPLANT
CHLORAPREP W/TINT 26ML (MISCELLANEOUS) ×2 IMPLANT
CLIP APPLIE ROT 10 11.4 M/L (STAPLE) ×1 IMPLANT
CLOTH BEACON ORANGE TIMEOUT ST (SAFETY) ×2 IMPLANT
COVER SURGICAL LIGHT HANDLE (MISCELLANEOUS) ×2 IMPLANT
DECANTER SPIKE VIAL GLASS SM (MISCELLANEOUS) ×2 IMPLANT
DERMABOND ADVANCED (GAUZE/BANDAGES/DRESSINGS) ×1
DERMABOND ADVANCED .7 DNX12 (GAUZE/BANDAGES/DRESSINGS) ×1 IMPLANT
DRAIN CHANNEL 19F RND (DRAIN) ×2 IMPLANT
DRAPE C-ARM 42X120 X-RAY (DRAPES) IMPLANT
DRAPE LAPAROSCOPIC ABDOMINAL (DRAPES) ×2 IMPLANT
ELECT REM PT RETURN 9FT ADLT (ELECTROSURGICAL) ×2
ELECTRODE REM PT RTRN 9FT ADLT (ELECTROSURGICAL) ×1 IMPLANT
ENDOLOOP SUT PDS II  0 18 (SUTURE) ×1
ENDOLOOP SUT PDS II 0 18 (SUTURE) ×1 IMPLANT
EVACUATOR SILICONE 100CC (DRAIN) ×2 IMPLANT
GLOVE SURG SS PI 7.5 STRL IVOR (GLOVE) ×4 IMPLANT
GOWN STRL NON-REIN LRG LVL3 (GOWN DISPOSABLE) ×2 IMPLANT
GOWN STRL REIN XL XLG (GOWN DISPOSABLE) ×4 IMPLANT
HEMOSTAT SURGICEL 4X8 (HEMOSTASIS) ×2 IMPLANT
IV CATH 14GX2 1/4 (CATHETERS) ×2 IMPLANT
KIT BASIN OR (CUSTOM PROCEDURE TRAY) ×2 IMPLANT
NS IRRIG 1000ML POUR BTL (IV SOLUTION) ×2 IMPLANT
PENCIL BUTTON HOLSTER BLD 10FT (ELECTRODE) ×2 IMPLANT
POUCH SPECIMEN RETRIEVAL 10MM (ENDOMECHANICALS) ×4 IMPLANT
SCISSORS LAP 5X35 DISP (ENDOMECHANICALS) IMPLANT
SET CHOLANGIOGRAPH MIX (MISCELLANEOUS) ×2 IMPLANT
SET IRRIG TUBING LAPAROSCOPIC (IRRIGATION / IRRIGATOR) IMPLANT
SPONGE DRAIN TRACH 4X4 STRL 2S (GAUZE/BANDAGES/DRESSINGS) ×2 IMPLANT
STRIP CLOSURE SKIN 1/2X4 (GAUZE/BANDAGES/DRESSINGS) IMPLANT
SUT ETHILON 2 0 PS N (SUTURE) ×2 IMPLANT
SUT MNCRL AB 4-0 PS2 18 (SUTURE) ×2 IMPLANT
SUT VICRYL 0 UR6 27IN ABS (SUTURE) ×2 IMPLANT
TOWEL OR 17X26 10 PK STRL BLUE (TOWEL DISPOSABLE) ×2 IMPLANT
TRAY LAP CHOLE (CUSTOM PROCEDURE TRAY) ×2 IMPLANT
TROCAR BALLN 12MMX100 BLUNT (TROCAR) ×2 IMPLANT
TROCAR BLADELESS OPT 5 75 (ENDOMECHANICALS) ×4 IMPLANT
TROCAR XCEL NON-BLD 11X100MML (ENDOMECHANICALS) ×2 IMPLANT
TUBING INSUFFLATION 10FT LAP (TUBING) ×2 IMPLANT

## 2013-04-29 NOTE — Preoperative (Signed)
Beta Blockers   Reason not to administer Beta Blockers:Not Applicable 

## 2013-04-29 NOTE — Anesthesia Postprocedure Evaluation (Signed)
Anesthesia Post Note  Patient: Timothy Chung  Procedure(s) Performed: Procedure(s) (LRB): LAPAROSCOPIC CHOLECYSTECTOMY WITH INTRAOPERATIVE CHOLANGIOGRAM (N/A)  Anesthesia type: General  Patient location: PACU  Post pain: Pain level controlled  Post assessment: Post-op Vital signs reviewed  Last Vitals: BP 126/79  Pulse 92  Temp(Src) 36.6 C (Oral)  Resp 18  Ht 6' (1.829 m)  Wt 238 lb (107.956 kg)  BMI 32.27 kg/m2  SpO2 96%  Post vital signs: Reviewed  Level of consciousness: sedated  Complications: No apparent anesthesia complications

## 2013-04-29 NOTE — Transfer of Care (Signed)
Immediate Anesthesia Transfer of Care Note  Patient: Timothy Chung  Procedure(s) Performed: Procedure(s) (LRB): LAPAROSCOPIC CHOLECYSTECTOMY WITH INTRAOPERATIVE CHOLANGIOGRAM (N/A)  Patient Location: PACU  Anesthesia Type: General  Level of Consciousness: sedated, patient cooperative and responds to stimulaton  Airway & Oxygen Therapy: Patient Spontanous Breathing and Patient connected to face mask oxgen  Post-op Assessment: Report given to PACU RN and Post -op Vital signs reviewed and stable  Post vital signs: Reviewed and stable  Complications: No apparent anesthesia complications

## 2013-04-29 NOTE — Interval H&P Note (Signed)
History and Physical Interval Note:  04/29/2013 2:50 PM  Timothy Chung  has presented today for surgery, with the diagnosis of cholelithiasis  The various methods of treatment have been discussed with the patient and family. After consideration of risks, benefits and other options for treatment, the patient has consented to  Procedure(s): LAPAROSCOPIC CHOLECYSTECTOMY WITH INTRAOPERATIVE CHOLANGIOGRAM (N/A) as a surgical intervention .  The patient's history has been reviewed, patient examined, no change in status, stable for surgery.  I have reviewed the patient's chart and labs.  Questions were answered to the patient's satisfaction.  I have seen and evaluated this patient.  He is feeling a little better but still has some mild RUQ discomfort.  I again discussed with him the procedure and the risks.  The risks of infection, bleeding, pain, persistent symptoms, scarring, injury to bowel or bile ducts, retained stone, diarrhea, need for additional procedures, and need for open surgery discussed with the patient.  He desires to proceed with laparoscopic cholecystectomy, possible open, possible cholangiogram.    Lodema Pilot DAVID

## 2013-04-29 NOTE — Brief Op Note (Signed)
04/29/2013  6:00 PM  PATIENT:  Christiane Ha  52 y.o. male  PRE-OPERATIVE DIAGNOSIS:  cholelithiasis  POST-OPERATIVE DIAGNOSIS:  cholelithiasis  PROCEDURE:  Procedure(s): LAPAROSCOPIC CHOLECYSTECTOMY WITH INTRAOPERATIVE CHOLANGIOGRAM (N/A)  SURGEON:  Surgeon(s) and Role:    * Lodema Pilot, DO - Primary    * Mariella Saa, MD - Assisting  PHYSICIAN ASSISTANT:   ASSISTANTS: [place]   ANESTHESIA:   general  EBL:  Total I/O In: 1300 [I.V.:1300] Out: 75 [Blood:75]  BLOOD ADMINISTERED:none  DRAINS: (20F) Jackson-Pratt drain(s) with closed bulb suction in the GB fossa   LOCAL MEDICATIONS USED:  MARCAINE    and LIDOCAINE   SPECIMEN:  Source of Specimen:  gallbladder  DISPOSITION OF SPECIMEN:  PATHOLOGY  COUNTS:  YES  TOURNIQUET:  * No tourniquets in log *  DICTATION: .Other Dictation: Dictation Number dictated  [id number]  PLAN OF CARE: Admit to inpatient   PATIENT DISPOSITION:  PACU - hemodynamically stable.   Delay start of Pharmacological VTE agent (>24hrs) due to surgical blood loss or risk of bleeding: no

## 2013-04-29 NOTE — Anesthesia Preprocedure Evaluation (Addendum)
Anesthesia Evaluation  Patient identified by MRN, date of birth, ID band Patient awake    Reviewed: Allergy & Precautions, H&P , NPO status , Patient's Chart, lab work & pertinent test results  Airway Mallampati: II TM Distance: >3 FB Neck ROM: Full    Dental  (+) Dental Advisory Given, Edentulous Upper and Poor Dentition   Pulmonary asthma ,  COPD inhaler,  breath sounds clear to auscultation        Cardiovascular negative cardio ROS  Rhythm:Regular Rate:Normal     Neuro/Psych PSYCHIATRIC DISORDERS negative neurological ROS  negative psych ROS   GI/Hepatic Neg liver ROS, GERD-  Medicated,  Endo/Other  negative endocrine ROS  Renal/GU negative Renal ROS     Musculoskeletal negative musculoskeletal ROS (+)   Abdominal   Peds  Hematology negative hematology ROS (+)   Anesthesia Other Findings   Reproductive/Obstetrics                          Anesthesia Physical Anesthesia Plan  ASA: II  Anesthesia Plan: General   Post-op Pain Management:    Induction: Intravenous  Airway Management Planned: Oral ETT  Additional Equipment:   Intra-op Plan:   Post-operative Plan: Extubation in OR  Informed Consent: I have reviewed the patients History and Physical, chart, labs and discussed the procedure including the risks, benefits and alternatives for the proposed anesthesia with the patient or authorized representative who has indicated his/her understanding and acceptance.   Dental advisory given  Plan Discussed with: CRNA  Anesthesia Plan Comments:         Anesthesia Quick Evaluation

## 2013-04-29 NOTE — H&P (View-Only) (Signed)
Patient ID: Timothy Chung, male   DOB: [birth year], 52 y.o.   MRN: [id number]  Chief Complaint  Patient presents with  . New Evaluation    G.B    HPI Timothy Chung is a 52 y.o. male.  This patient is referred by Timothy Chung for evaluation of symptomatic cholelithiasis. He recently awoke from sleep at about 3:00 in the morning feeling as though he had indigestion after eating [place] food.  He takes Prilosec about every Chung day for reflux but this has not improved his symptoms. He had some nausea as well as but denies any fevers or chills. He has had some constipation he took some magnesium citrate and has moved his bowels but this has not improved his symptoms. He describes his pain as right upper quadrant under history of what she describes as a constant and dull pain which radiates to his back. He says he feels much better than he did initially but he still has some mild discomfort in the area. He has been eating fruits without difficulty since then. He saw his primary care physician and he ordered some laboratory studies which demonstrated normal white blood cell count and a bilirubin of 2.0 and an amylase of 32. He also ordered an ultrasound which demonstrated and nonmobile gallstones at the neck of the gallbladder with edematous wall concerning for acute cholecystitis. Also on ultrasound demonstrated a solid left renal mass concerning for renal cell carcinoma. The patient tells me that his doctor has told him that he is sitting him up with the urology referral for further evaluation of this.  HPI  Past Medical History  Diagnosis Date  . Allergy   . Positive TB test   . Asthma, intermittent 01/24/2011  . Increased prostate specific antigen (PSA) velocity 01/24/2011    No past surgical history on file.  Family History  Problem Relation Age of Onset  . Hypertension Mother   . Diabetes Mother   . Hypertension Father   . Diabetes Father     Social History History  Substance Use Topics   . Smoking status: Former Games developer  . Smokeless tobacco: Not on file     Comment: quit 09/24/2010  . Alcohol Use: Yes     Comment: Drinks a beer occasionally    Allergies  Allergen Reactions  . Ibuprofen Chung (See Comments)    wheezing  . Penicillins     Current Outpatient Prescriptions  Medication Sig Dispense Refill  . albuterol (VENTOLIN HFA) 108 (90 BASE) MCG/ACT inhaler Inhale 2 puffs into the lungs 2 (two) times daily.        Marland Kitchen aspirin 81 MG EC tablet Take 81 mg by mouth daily.        Marland Kitchen atorvastatin (LIPITOR) 20 MG tablet TAKE 1 TABLET BY MOUTH EVERY DAY  30 tablet  1  . HYDROcodone-acetaminophen (NORCO) 7.5-325 MG per tablet       . Mometasone Furo-Formoterol Fum (DULERA) 100-5 MCG/ACT AERO Inhale 2 puffs into the lungs 2 (two) times daily.        . montelukast (SINGULAIR) 10 MG tablet       . omeprazole (PRILOSEC) 20 MG capsule Take 20 mg by mouth daily.        . cetirizine (ZYRTEC) 10 MG tablet Take 10 mg by mouth 2 (two) times daily.        . Cholecalciferol (VITAMIN D3) 1000 UNITS CAPS Take by mouth daily.        . mometasone (NASONEX) 50  MCG/ACT nasal spray 2 sprays by Nasal route daily.  17 g  2   No current facility-administered medications for this visit.    Review of Systems Review of Systems All Chung review of systems negative or noncontributory except as stated in the HPI  Blood pressure 130/90, pulse 108, temperature 98 F (36.7 C), resp. rate 18, height 6' (1.829 m), weight 238 lb (107.956 kg).  Physical Exam Physical Exam Physical Exam  Vitals reviewed. Constitutional: He is oriented to person, place, and time. He appears well-developed and well-nourished. No distress.  HENT:  Head: Normocephalic and atraumatic.  Mouth/Throat: No oropharyngeal exudate.  Eyes: Conjunctivae and EOM are normal. Pupils are equal, round, and reactive to light. Right eye exhibits no discharge. Left eye exhibits no discharge. No scleral icterus.  Neck: Normal range of  motion. No tracheal deviation present.  Cardiovascular: Normal rate, regular rhythm and normal heart sounds.   Pulmonary/Chest: Effort normal and breath sounds normal. No stridor. No respiratory distress. He has no wheezes. He has no rales. He exhibits no tenderness.  Abdominal: Soft. Bowel sounds are normal. He exhibits no distension and no mass. There is very mild  Tenderness in the RUQ but no Murphy's sign and no peritonitis. There is no rebound and no guarding.  Musculoskeletal: Normal range of motion. He exhibits no edema and no tenderness.  Neurological: He is alert and oriented to person, place, and time.  Skin: Skin is warm and dry. No rash noted. He is not diaphoretic. No erythema. No pallor.  Psychiatric: He has a normal mood and affect. His behavior is normal. Judgment and thought content normal.  Data Reviewed [place] and labs and clinic notes  Assessment    Cholelithiasis and concern for acute cholecystitis He does have gallstones on ultrasound as well as some wall thickening and some persistent right upper quadrant pain concerning for acute cholecystitis. I discussed with him the options for admission to the hospital with early cholecystectomy and I have recommended this option given his persistent pain and ultrasound findings. However, he did not want to pursue this option because he is concerned for his work schedule and coordinating any possible procedure with his work. Since he is declining urgent surgery now, I will put him on the schedule for as early as I can't next week and hold for now we'll give him time to workout his issues with his employer. I discussed with him the pros and cons of these options and explained that if this is acute cholecystitis at his infection could worsen.  I explained that if his symptoms increase or develops any fevers or chills that he should go to the emergency room as soon as possible for urgent treatment. I also discussed with them the plan procedure and  the risks and benefits of this. The risks of infection, bleeding, pain, persistent symptoms, scarring, injury to bowel or bile ducts, retained stone, diarrhea, need for additional procedures, and need for open surgery discussed with the patient.  I will go ahead and put him on the schedule for cholecystectomy next week but if he develops any increasing pain or fevers and he should call spectacle to the emergency room as soon as possible for cholecystectomy     Plan           Brandalynn Ofallon DAVID 04/25/2013, 10:10 AM

## 2013-04-30 ENCOUNTER — Encounter (HOSPITAL_COMMUNITY): Payer: Self-pay | Admitting: General Surgery

## 2013-04-30 LAB — CBC
MCH: 30.7 pg (ref 26.0–34.0)
MCV: 88.7 fL (ref 78.0–100.0)
Platelets: 270 10*3/uL (ref 150–400)
RDW: 12.1 % (ref 11.5–15.5)

## 2013-04-30 LAB — COMPREHENSIVE METABOLIC PANEL
ALT: 49 U/L (ref 0–53)
AST: 34 U/L (ref 0–37)
Albumin: 2.9 g/dL — ABNORMAL LOW (ref 3.5–5.2)
Alkaline Phosphatase: 79 U/L (ref 39–117)
Chloride: 97 mEq/L (ref 96–112)
Potassium: 4.4 mEq/L (ref 3.5–5.1)
Total Bilirubin: 0.5 mg/dL (ref 0.3–1.2)

## 2013-04-30 NOTE — Op Note (Signed)
NAMEMarland Kitchen  Timothy, Chung NO.:  [id number]  MEDICAL RECORD NO.:  [id number]  LOCATION:  1533                         FACILITY:  [facility]  PHYSICIAN:  Lodema Pilot, MD       DATE OF BIRTH:  1961-07-13  DATE OF PROCEDURE:  04/29/2013 DATE OF DISCHARGE:                              OPERATIVE REPORT   PREOPERATIVE DIAGNOSIS:  Symptomatic cholelithiasis.  POSTOPERATIVE DIAGNOSES:  Acute cholecystitis.  PROCEDURE:  Laparoscopic cholecystectomy with attempted intraoperative cholangiogram and drain placement.  SURGEON:  Lodema Pilot, MD  ASSISTANT:  Lorne Skeens. Hoxworth, M.D.  ANESTHESIA:  General endotracheal tube anesthesia with 40 mL of 1% lidocaine with epinephrine and 0.25% Marcaine in 50:50 mixture.  FLUID:  1300 mL crystalloid.  ESTIMATED BLOOD LOSS:  75-100 mL.  DRAINS:  A 19-French Blake drain placed in the gallbladder fossa.  COMPLICATIONS:  None apparent.  FINDINGS:  Infected and inflamed gallbladder containing purulent material.  Attempted cholangiogram was unable to be performed due to intrahepatic gallbladder, and 19-French Blake drain placed in the gallbladder fossa.  INDICATION FOR PROCEDURE:  Timothy Chung is a 52 year old male who has had 9 days of consistent right upper quadrant since eating [place] food. His pain has improved but continues, and he had ultrasound demonstrating cholelithiasis and a thickened wall, and some pericholecystic fluid.  I saw him on Friday in the office and I recommended immediate surgery and admission, but he refused at that time, and that we set him up for the soonest elective date so he could work out some affairs at work.  OPERATIVE DETAILS:  Timothy Chung was seen and evaluated in the preoperative area and risks and benefits of the procedure were again discussed in lay terms.  Informed consent was obtained.  He continued to have right upper quadrant pain and I explained to him there would be a higher chance of open  procedure, difficulty visualizing the critical structures and he expressed understanding and desired to proceed with cholecystectomy.  He was given  prophylactic antibiotics and taken to the operating room, placed on the table in supine position.  General endotracheal tube anesthesia was obtained, and the abdomen was prepped and draped in the usual standard surgical fashion.  Procedure time-out was performed with all operative team members to confirm proper patient and procedure.  A supraumbilical midline incision was made on the skin, dissection was carried down to subcutaneous tissue using blunt dissection.  The abdominal wall fascia was elevated and sharply incised. The peritoneum entered bluntly.  The 12-mm balloon port was placed and pneumoperitoneum was obtained.  Laparoscope was introduced.  There was no evidence of bleeding or bowel injury upon entry.  The abdomen was explored and the omentum was adherent up over the gallbladder and liver. I placed an 11-mm epigastric port and two 5-mm right upper quadrant ports under direct visualization.  The omentum was indeed fixed to the gallbladder and liver, stuck to thickened and edematous gallbladder consistent with acute cholecystitis.  I was able to  pull down some of the omentum, and the gallbladder was visualized.  It was very dense and difficult to grab, so we had to decompress the gallbladder initially. There  appeared to be hydra optic clear fluid in the gallbladder, but eventually purulent fluid came out of the gallbladder.  This allowed [place] to grab the gallbladder retracted cephalad.  The stomach and duodenum were closed with the knot directly adherent.  The omentum was peeled down and he had thick grind over the gallbladder.  The anatomy was difficult to see.  He had a very atraumatic gallbladder and we retracted the Hartmann's pouch of the gallbladder, up in the lateral end tried to open up the triangle of Calot. Most of the  dissection was done bluntly, peeling off the rind from the gallbladder until I was able to visualize a lymph node in the triangle of Calot.  This was clipped and pulled down from the gallbladder.  A good while of dissection of just mainly blunt dissection able to identify the wall of the gallbladder and the cystic duct.  I was able to develop a window through the triangle of Calot and isolate the cystic duct.  I placed a cholangiogram catheter through the abdominal wall and made a small cystic ductotomy  high on the cystic duct as I entered the gallbladder and passed a Reddick cholangiogram catheter in the cystic duct.  However, I could never get a good seal around the catheter and was not able to do a cholangiogram.  Because we could see the wall of the gallbladder and the cystic duct clearly entering the gallbladder, we decided that it should be safe to clip the cystic duct within this area high on the gallbladder.  Two clips were placed on the cystic duct stump and another clip was placed on the gallbladder side and the duct was divided.  I continued using blunt dissection to elevate the gallbladder from the critical structures.  The cystic artery was never really visualized and was likely associated with the node which I took down previously.  After I was away from the critical structures, I used Bovie electrocautery to try to take the gallbladder out of the gallbladder fossa.  However, the plane was very difficult to see, the gallbladder was intrahepatic as well as the intense amount of inflammatory changes.  As we got to the most intrahepatic portion of the gallbladder near the fundus, we decided to leave some of the back wall of the gallbladder.  The gallbladder was opened.  He had a single large gallstone, which was placed in a separate EndoCatch bag.  I continued to take off the remainder of the gallbladder except the left small patch of the gallbladder on the liver for the  last 1/3rd-1/4 of the back wall.  The remainder of the gallbladder was placed in an EndoCatch bag and these were both removed from the umbilical trocar site, sent to Pathology for permanent section, and cauterized. The back wall of the gallbladder was left on the liver and placed some Surgicel gauze in the gallbladder fossa after I had suctioned the purulent fluid and irrigated with a few liters of sterile saline solution.  The irrigation returned clear and the clips appeared to be in good position.  There was no evidence of bile leakage, and no evidence of bleeding.  There is no evidence of bowel injury.  Because of the infected, inflamed gallbladder and left the back wall of the gallbladder last, I decided to leave a drain in the gallbladder fossa.  I placed a 19-French Blake drain into the abdomen and exited through one of the 5- mm trocar sites in the right upper quadrant.  This was sutured in place with a nylon drain stitch.  I suctioned the remainder of the fluid and removed the specimen and then approximated the umbilical fascia with interrupted 0 Vicryl sutures in open fashion.  The sutures were secured and the abdomen was re-insufflated with carbon dioxide gas.  The right upper quadrant appeared hemostatic again without any evidence of bleeding or bile or bowel injury, and final trocar was removed under direct visualization.  The wounds were injected with 40 mL of 1% lidocaine with epinephrine and 0.25% Marcaine in a 50:50 mixture. The skin edges were approximated with 4-0 Monocryl subcuticular suture. Skin was washed and dried.  Dermabond was applied.  All sponge, needle, and instrument counts were correct at end of the case.  The patient tolerated the procedure well without apparent complication.          ______________________________ Lodema Pilot, MD     BL/MEDQ  D:  04/29/2013  T:  04/30/2013  Job:  [id number]

## 2013-04-30 NOTE — Care Management Note (Signed)
    Page 1 of 1   04/30/2013     11:31:58 AM   CARE MANAGEMENT NOTE 04/30/2013  Patient:  BURGESS, SHERIFF   Account Number:  [id number]  Date Initiated:  04/30/2013  Documentation initiated by:  Lorenda Ishihara  Subjective/Objective Assessment:   52 yo male admitted s/p lap chole with drain placement. PTA lived at home alone.     Action/Plan:   Home when stable   Anticipated DC Date:  05/03/2013   Anticipated DC Plan:  HOME/SELF CARE      DC Planning Services  CM consult      Choice offered to / List presented to:             Status of service:  Completed, signed off Medicare Important Message given?   (If response is "NO", the following Medicare IM given date fields will be blank) Date Medicare IM given:   Date Additional Medicare IM given:    Discharge Disposition:  HOME/SELF CARE  Per UR Regulation:  Reviewed for med. necessity/level of care/duration of stay  If discussed at Long Length of Stay Meetings, dates discussed:    Comments:

## 2013-04-30 NOTE — Progress Notes (Signed)
1 Day Post-Op  Subjective: Feeling okay this am.  No issues overnight  Objective: Vital signs in last 24 hours: Temp:  [97.8 F (36.6 C)-99.6 F (37.6 C)] 99.2 F (37.3 C) (08/06 0622) Pulse Rate:  [87-108] 96 (08/06 0622) Resp:  [16-24] 18 (08/06 0622) BP: (108-143)/(69-82) 108/71 mmHg (08/06 0622) SpO2:  [94 %-99 %] 96 % (08/06 0622) Weight:  [227 lb 11.8 oz (103.3 kg)-238 lb (107.956 kg)] 227 lb 11.8 oz (103.3 kg) (08/06 0155) Last BM Date: 04/28/13  Intake/Output from previous day: 08/05 0701 - 08/06 0700 In: 2500 [P.O.:1200; I.V.:1300] Out: 285 [Drains:210; Blood:75] Intake/Output this shift:    General appearance: alert, cooperative and no distress Resp: nonlabored Cardio: normal rate, regular GI: soft, mild tenderness, mild distension, wounds okay, JP without sign of bile, no peritoneal signs  Lab Results:   Recent Labs  04/28/13 1130 04/30/13 0415  WBC 8.6 12.9*  HGB 13.8 13.0  HCT 40.6 37.5*  PLT 285 270   BMET  Recent Labs  04/28/13 1130 04/30/13 0415  NA 135 132*  K 3.9 4.4  CL 97 97  CO2 29 26  GLUCOSE 104* 167*  BUN 9 10  CREATININE 0.95 0.86  CALCIUM 9.5 9.3   PT/INR No results found for this basename: LABPROT, INR,  in the last 72 hours ABG No results found for this basename: PHART, PCO2, PO2, HCO3,  in the last 72 hours  Studies/Results: No results found.  Anti-infectives: Anti-infectives   Start     Dose/Rate Route Frequency Ordered Stop   04/30/13 0300  ciprofloxacin (CIPRO) IVPB 400 mg     400 mg 200 mL/hr over 60 Minutes Intravenous Every 12 hours 04/29/13 1902     04/29/13 1215  ciprofloxacin (CIPRO) IVPB 400 mg     400 mg 200 mL/hr over 60 Minutes Intravenous On call to O.R. 04/29/13 1201 04/29/13 1511      Assessment/Plan: s/p Procedure(s): LAPAROSCOPIC CHOLECYSTECTOMY WITH INTRAOPERATIVE CHOLANGIOGRAM (N/A) He looks okay.  mild distension so will be slow with diet.  mobilize today.  continue cipro for acute purulent  cholecystitis.  LOS: 1 day    Timothy Chung DAVID 04/30/2013

## 2013-05-01 ENCOUNTER — Telehealth (INDEPENDENT_AMBULATORY_CARE_PROVIDER_SITE_OTHER): Payer: Self-pay

## 2013-05-01 LAB — CBC WITH DIFFERENTIAL/PLATELET
Basophils Absolute: 0 10*3/uL (ref 0.0–0.1)
Basophils Relative: 0 % (ref 0–1)
Eosinophils Absolute: 0.2 10*3/uL (ref 0.0–0.7)
MCHC: 33.6 g/dL (ref 30.0–36.0)
Monocytes Absolute: 1 10*3/uL (ref 0.1–1.0)
Neutro Abs: 6.2 10*3/uL (ref 1.7–7.7)
Neutrophils Relative %: 70 % (ref 43–77)
RDW: 12.4 % (ref 11.5–15.5)

## 2013-05-01 LAB — COMPREHENSIVE METABOLIC PANEL
AST: 59 U/L — ABNORMAL HIGH (ref 0–37)
Albumin: 2.5 g/dL — ABNORMAL LOW (ref 3.5–5.2)
Chloride: 96 mEq/L (ref 96–112)
Creatinine, Ser: 0.82 mg/dL (ref 0.50–1.35)
Total Bilirubin: 0.7 mg/dL (ref 0.3–1.2)
Total Protein: 6.8 g/dL (ref 6.0–8.3)

## 2013-05-01 MED ORDER — HYDROCODONE-ACETAMINOPHEN 7.5-325 MG PO TABS: 1.0000 | ORAL_TABLET | ORAL | Status: DC | PRN

## 2013-05-01 MED ORDER — CIPROFLOXACIN HCL 500 MG PO TABS: 500.0000 mg | ORAL_TABLET | Freq: Two times a day (BID) | ORAL | Status: DC

## 2013-05-01 NOTE — Telephone Encounter (Signed)
[facility] called about the pt.  The pt brought the prescription to them for Hydrocodone 7.5/325.  He did not fill it at [facility] earlier with 5/325.  He asked if ok to fill the 7.5 and I said yes since he didn't fill [facility] at [facility].

## 2013-05-01 NOTE — Telephone Encounter (Signed)
[facility] called to report the Norco 7.5/325 is on back order.  They want to know what to do.  Per Dr Biagio Quint order Norco 5/325.  The pharmacist accepted the order and I told her use the same instructions and quantity.

## 2013-05-01 NOTE — Discharge Summary (Signed)
  Physician Discharge Summary  Patient ID: Timothy Chung MRN: [id number] DOB/AGE: [birth year] 51 y.o.  Admit date: 04/29/2013 Discharge date: 05/01/2013  Admission Diagnoses: cholecystitis  Discharge Diagnoses: same Active Problems:   * No active hospital problems. *   Discharged Condition: stable  Hospital Course: to OR 04/29/13 for lap chole.  No apparent complications but kept for IV abx and to ensure that there were no bile leaks.  Diet advanced and no evidence of leak.  Drain removed and ready for discharge to home on POD 2  Consults: None  Significant Diagnostic Studies: none  Treatments: surgery: 04/29/13 lap chole  Disposition: 01-Home or Self Care  Discharge Orders   Future Appointments Provider Department Dept Phone   05/16/2013 9:45 AM Lodema Pilot, DO [facility], [place] [phone]   Future Orders Complete By Expires     Call MD for:  persistant nausea and vomiting  As directed     Call MD for:  redness, tenderness, or signs of infection (pain, swelling, redness, odor or green/yellow discharge around incision site)  As directed     Call MD for:  severe uncontrolled pain  As directed     Call MD for:  temperature >100.4  As directed     Diet - low sodium heart healthy  As directed     Discharge instructions  As directed     Comments:      Call [phone] to schedule follow up visit in about 2 weeks or sooner as needed. May shower tomorrow.    Increase activity slowly  As directed         Medication List         aspirin 81 MG EC tablet  Take 81 mg by mouth every morning.     atorvastatin 20 MG tablet  Commonly known as:  LIPITOR  Take 20 mg by mouth at bedtime.     ciprofloxacin 500 MG tablet  Commonly known as:  CIPRO  Take 1 tablet (500 mg total) by mouth 2 (two) times daily.     CLARITIN-D 24 HOUR 10-240 MG per 24 hr tablet  Generic drug:  loratadine-pseudoephedrine  Take 1 tablet by mouth daily.     DULERA 100-5 MCG/ACT Aero  Generic drug:   mometasone-formoterol  Inhale 2 puffs into the lungs 2 (two) times daily as needed for wheezing.     HYDROcodone-acetaminophen 7.5-325 MG per tablet  Commonly known as:  NORCO  Take 1 tablet by mouth every 6 (six) hours as needed for pain.     HYDROcodone-acetaminophen 7.5-325 MG per tablet  Commonly known as:  NORCO  Take 1 tablet by mouth every 4 (four) hours as needed.     montelukast 10 MG tablet  Commonly known as:  SINGULAIR  Take 10 mg by mouth at bedtime.     omeprazole 20 MG capsule  Commonly known as:  PRILOSEC  Take 20 mg by mouth daily as needed (heart burn).     VENTOLIN HFA 108 (90 BASE) MCG/ACT inhaler  Generic drug:  albuterol  Inhale 2 puffs into the lungs every 4 (four) hours as needed for wheezing or shortness of breath.         SignedLodema Pilot DAVID 05/01/2013, 7:21 AM

## 2013-05-01 NOTE — Progress Notes (Signed)
2 Days Post-Op  Subjective: Tolerating diet.  No nausea  Objective: Vital signs in last 24 hours: Temp:  [98.3 F (36.8 C)-99.4 F (37.4 C)] 98.5 F (36.9 C) (08/07 0530) Pulse Rate:  [91-109] 95 (08/07 0530) Resp:  [18] 18 (08/07 0530) BP: (115-131)/(68-78) 118/77 mmHg (08/07 0530) SpO2:  [90 %-100 %] 94 % (08/07 0530) Last BM Date: 04/28/13  Intake/Output from previous day: 08/06 0701 - 08/07 0700 In: 3240 [P.O.:240; I.V.:3000] Out: 605 [Urine:550; Drains:55] Intake/Output this shift:    General appearance: alert, cooperative and no distress Resp: clear to auscultation bilaterally Cardio: normal rate, regular GI: soft, mild incisional tenderness, ND, wounds without infection, JP ss output 20ml overnight  Lab Results:   Recent Labs  04/30/13 0415 05/01/13 0405  WBC 12.9* 8.8  HGB 13.0 12.9*  HCT 37.5* 38.4*  PLT 270 276   BMET  Recent Labs  04/30/13 0415 05/01/13 0405  NA 132* 134*  K 4.4 3.8  CL 97 96  CO2 26 28  GLUCOSE 167* 116*  BUN 10 9  CREATININE 0.86 0.82  CALCIUM 9.3 9.1   PT/INR No results found for this basename: LABPROT, INR,  in the last 72 hours ABG No results found for this basename: PHART, PCO2, PO2, HCO3,  in the last 72 hours  Studies/Results: No results found.  Anti-infectives: Anti-infectives   Start     Dose/Rate Route Frequency Ordered Stop   04/30/13 0300  ciprofloxacin (CIPRO) IVPB 400 mg     400 mg 200 mL/hr over 60 Minutes Intravenous Every 12 hours 04/29/13 1902     04/29/13 1215  ciprofloxacin (CIPRO) IVPB 400 mg     400 mg 200 mL/hr over 60 Minutes Intravenous On call to O.R. 04/29/13 1201 04/29/13 1511      Assessment/Plan: s/p Procedure(s): LAPAROSCOPIC CHOLECYSTECTOMY WITH INTRAOPERATIVE CHOLANGIOGRAM (N/A) tolerating diet.  wounds and abdomen look okay.  should be okay for discharge today.  Will keep on oral abx for another few days.  No evidence of bile leak.  Will remove drain.  LOS: 2 days    Lodema Pilot DAVID 05/01/2013

## 2013-05-02 ENCOUNTER — Telehealth (INDEPENDENT_AMBULATORY_CARE_PROVIDER_SITE_OTHER): Payer: Self-pay

## 2013-05-02 NOTE — Telephone Encounter (Signed)
Pt called wanting to know if he can take stool softeners while taking pain med. Pt advised stool softeners can be taken while taking pain meds. Pt states he had GB surgery Tuesday and does not want to become constipated. Pt encouraged to increase fluids while on pain meds.  Pt to call if constipation occurs and is not resolved with stool softeners.

## 2013-05-13 ENCOUNTER — Encounter (INDEPENDENT_AMBULATORY_CARE_PROVIDER_SITE_OTHER): Payer: Self-pay

## 2013-05-14 ENCOUNTER — Encounter (INDEPENDENT_AMBULATORY_CARE_PROVIDER_SITE_OTHER): Payer: Self-pay

## 2013-05-16 ENCOUNTER — Encounter (INDEPENDENT_AMBULATORY_CARE_PROVIDER_SITE_OTHER): Payer: Self-pay | Admitting: General Surgery

## 2013-05-16 ENCOUNTER — Ambulatory Visit (INDEPENDENT_AMBULATORY_CARE_PROVIDER_SITE_OTHER): Payer: BC Managed Care – PPO | Admitting: General Surgery

## 2013-05-16 VITALS — BP 96/64 | HR 96 | Temp 96.7°F | Resp 16 | Ht 72.0 in | Wt 226.6 lb

## 2013-05-16 DIAGNOSIS — Z5189 Encounter for other specified aftercare: Secondary | ICD-10-CM

## 2013-05-16 DIAGNOSIS — Z4889 Encounter for other specified surgical aftercare: Secondary | ICD-10-CM

## 2013-05-16 NOTE — Progress Notes (Signed)
Subjective:     Patient ID: Timothy Chung, male   DOB: September 20, 1961, 52 y.o.   MRN: [id number]  HPI  This patient follows up status post cholecystectomy for acute cholecystitis. He says that he is returned to work and has no residual pain and is willing to take the pain medication. His pathology was consistent with acute cholecystitis but benign. He is tolerating regular diet and his bowels are normal. He has no complaints. Review of Systems     Objective:   Physical Exam His abdomen is soft and nontender exam his incisions are healing nicely without any sign of infection.    Assessment:     Status post laparoscope cholecystectomy for acute cholecystitis-doing well His pathology was benign and he feels much better. He is no longer having any abdominal pain and easy and regular diet and there is no evidence of any postoperative complication. He should be able to return to work as tolerated without limitations and he can gradually increase his physical activity as tolerated.     Plan:     Return to work and activity as tolerated and followup when necessary

## 2013-05-29 ENCOUNTER — Encounter (INDEPENDENT_AMBULATORY_CARE_PROVIDER_SITE_OTHER): Payer: Self-pay

## 2013-07-10 ENCOUNTER — Encounter (INDEPENDENT_AMBULATORY_CARE_PROVIDER_SITE_OTHER): Payer: Self-pay

## 2013-11-14 ENCOUNTER — Other Ambulatory Visit (HOSPITAL_COMMUNITY): Payer: Self-pay | Admitting: Urology

## 2013-11-14 DIAGNOSIS — N2889 Other specified disorders of kidney and ureter: Secondary | ICD-10-CM

## 2013-11-14 DIAGNOSIS — N289 Disorder of kidney and ureter, unspecified: Secondary | ICD-10-CM

## 2013-12-17 ENCOUNTER — Ambulatory Visit (HOSPITAL_COMMUNITY)
Admission: RE | Admit: 2013-12-17 | Discharge: 2013-12-17 | Disposition: A | Discharge status: Home / Self Care | Payer: BC Managed Care – PPO | Source: Ambulatory Visit | Attending: Urology | Admitting: Urology

## 2013-12-17 DIAGNOSIS — N289 Disorder of kidney and ureter, unspecified: Secondary | ICD-10-CM

## 2013-12-17 DIAGNOSIS — Z9089 Acquired absence of other organs: Secondary | ICD-10-CM | POA: Insufficient documentation

## 2013-12-17 DIAGNOSIS — Q6101 Congenital single renal cyst: Secondary | ICD-10-CM | POA: Insufficient documentation

## 2013-12-17 DIAGNOSIS — N2889 Other specified disorders of kidney and ureter: Secondary | ICD-10-CM

## 2013-12-17 DIAGNOSIS — K7689 Other specified diseases of liver: Secondary | ICD-10-CM | POA: Insufficient documentation

## 2013-12-17 MED ORDER — GADOBENATE DIMEGLUMINE 529 MG/ML IV SOLN
20.0000 mL | Freq: Once | INTRAVENOUS | Status: AC | PRN
  Administered 2013-12-17: 20 mL via INTRAVENOUS

## 2013-12-24 ENCOUNTER — Other Ambulatory Visit: Payer: Self-pay | Admitting: Urology

## 2013-12-24 DIAGNOSIS — N2889 Other specified disorders of kidney and ureter: Secondary | ICD-10-CM

## 2013-12-30 ENCOUNTER — Ambulatory Visit
Admission: RE | Admit: 2013-12-30 | Discharge: 2013-12-30 | Disposition: A | Discharge status: Home / Self Care | Payer: BC Managed Care – PPO | Source: Ambulatory Visit | Attending: Urology | Admitting: Urology

## 2013-12-30 DIAGNOSIS — N2889 Other specified disorders of kidney and ureter: Secondary | ICD-10-CM

## 2014-01-02 ENCOUNTER — Other Ambulatory Visit: Payer: Self-pay | Admitting: Interventional Radiology

## 2014-01-02 DIAGNOSIS — N2889 Other specified disorders of kidney and ureter: Secondary | ICD-10-CM

## 2014-01-14 ENCOUNTER — Emergency Department (HOSPITAL_COMMUNITY)
Admission: EM | Admit: 2014-01-14 | Discharge: 2014-01-23 | Disposition: E | Payer: BC Managed Care – PPO | Attending: Emergency Medicine | Admitting: Emergency Medicine

## 2014-01-14 DIAGNOSIS — I469 Cardiac arrest, cause unspecified: Secondary | ICD-10-CM | POA: Insufficient documentation

## 2014-01-14 DIAGNOSIS — Z87448 Personal history of other diseases of urinary system: Secondary | ICD-10-CM | POA: Insufficient documentation

## 2014-01-14 DIAGNOSIS — J45909 Unspecified asthma, uncomplicated: Secondary | ICD-10-CM | POA: Insufficient documentation

## 2014-01-14 DIAGNOSIS — Z9089 Acquired absence of other organs: Secondary | ICD-10-CM | POA: Insufficient documentation

## 2014-01-14 MED ORDER — SODIUM BICARBONATE 8.4 % IV SOLN
INTRAVENOUS | Status: DC | PRN
  Administered 2014-01-14 (×2): 50 meq via INTRAVENOUS

## 2014-01-14 MED ORDER — EPINEPHRINE HCL 0.1 MG/ML IJ SOSY
PREFILLED_SYRINGE | INTRAMUSCULAR | Status: DC | PRN
  Administered 2014-01-14 (×2): 1 via INTRAVENOUS

## 2014-01-14 MED FILL — Medication: Qty: 1 | Status: AC

## 2014-01-14 NOTE — Code Documentation (Addendum)
Rhythm check shows asystole. Continue CPR.

## 2014-01-14 NOTE — Code Documentation (Signed)
Rhythm check shows asystole. Continue CPR. 

## 2014-01-14 NOTE — ED Notes (Signed)
Still not able to locate family. [employer] was requested to go by house. No one was present. They will continue to go by house.

## 2014-01-14 NOTE — Code Documentation (Signed)
Patient time of death occurred at [id number]

## 2014-01-14 NOTE — Progress Notes (Signed)
Chaplain responded to CPR in ED and served as liaison between pt's coworkers and staff. Chaplain attempted to contact pt's next of kin but was unable to due to the limited information in his file and his coworker's casual relationship with pt. Chaplain worked with [employer], who sent an officer to the pt's house, but were still unable to find a family member. [employer] looked through pt's belongings and phone but couldn't locate next of kin. Coworkers stated that pt has daughter but could not give name or contact info. Chaplain available for follow up as necessary. Pt's supervisor stated he will call hospital in the morning if he can find any more information in pt's personnel file.

## 2014-01-14 NOTE — Code Documentation (Signed)
Rhythm check shows asystole.

## 2014-01-14 NOTE — ED Notes (Signed)
Pt. Was at work unknown down time. Last seen alive 1.5hrs PTA. Medical illustratorire Dept. Placed AED with no shocks advised at 0045. And started CPR. EMS had Asystole on monitor. Placed #4 King airway. Placed IO to left tib. He was given 7 rounds of epinephrine, 1 amp of D50, and Narcan 4mg . Per co-workers he was found at world with clip board in his hand and hard hat in place.

## 2014-01-14 NOTE — Code Documentation (Signed)
Cpr in progress  

## 2014-01-14 NOTE — ED Provider Notes (Addendum)
CSN: [id number]     Arrival date & time December 18, 2013  0119 History   First MD Initiated Contact with Patient 0March 26, 2015 0136     Chief Complaint  Patient presents with  . Cardiac Arrest     (Consider location/radiation/quality/duration/timing/severity/associated sxs/prior Treatment) The history is provided by the EMS personnel. The history is limited by the condition of the patient (CPR in progress).   53 year old male is brought in with CPR in progress. He was apparently found unresponsive at work and was last seen by someone else about one hour prior to that. Fire rescue arrived at 12:45 AM and AED advised no shock and they started CPR. EMS arrived and placed a King airway and inserted a left intraosseous line, and gave given him 5 doses of epinephrine but have never seen a rhythm other than asystole. Gross emesis was present at the [facility].  No past medical history on file. No past surgical history on file. No family history on file. History  Substance Use Topics  . Smoking status: Not on file  . Smokeless tobacco: Not on file  . Alcohol Use: Not on file    Review of Systems  Unable to perform ROS: Patient unresponsive      Allergies  Review of patient's allergies indicates not on file.  Home Medications   Prior to Admission medications   Not on File   There were no vitals taken for this visit. Physical Exam  Nursing note and vitals reviewed.  53 year old male with CPR in progress. Vital signs are absent, and CPR is in progress. Head is normocephalic and atraumatic. Pupils are midposition and fixed. King airway is in place with obvious stomach contents present within it. Lungs: No definite air movement is detected when bagging through [facility]. Chest has no deformity or crepitus. Heart tones are absent. Abdomen is soft, flat. Extremities have no cyanosis or edema. Intraosseous line is present in the left tibia. Skin is warm and dry without  rash. Neurologic: GCS=3.  ED Course  Procedures (including critical care time) Cardiopulmonary Resuscitation (CPR) Procedure Note Directed/Performed by: Dione Boozeavid Molleigh Huot I personally directed ancillary staff and/or performed CPR in an effort to regain return of spontaneous circulation and to maintain cardiac, neuro and systemic perfusion.   INTUBATION Performed by: Dione Boozeavid Cheria Sadiq  Required items: required blood products, implants, devices, and special equipment available Patient identity confirmed: provided demographic data and hospital-assigned identification number Time out: Immediately prior to procedure a "time out" was called to verify the correct patient, procedure, equipment, support staff and site/side marked as required.  Indications: Cardiac arrest, King airway with gross scout stomach contents present   Intubation method: Direct Laryngoscopy  Large amount of stomach contents present in the oropharynx requiring vigorous suctioning.   Preoxygenation: Bag-valve-King airway   Sedatives: None Paralytic: None  Tube Size: 8.0 cuffed  Post-procedure assessment: chest rise and ETCO2 monitor Breath sounds: equal and absent over the epigastrium Stomach contents were seen coming up through the tube and he was aggressively suctioned through the tube Tube secured with: ETT holder  Patient tolerated the procedure well with no immediate complications.    MDM   Final diagnoses:  Cardiac arrest    Patient with CPR in progress for only 40 minutes. King airway had obvious stomach contents present and was switched out for an endotracheal tube. There was obviously gross aspiration. Cardiac monitor showed asystole on arrival. He was given 3 additional doses of epinephrine and 2  doses of sodium bicarbonate and monitor never changed from asystole. He was pronounced dead at 1:33 AM. At this time, CPR had been done for 48 minutes and HEENT shows no evidence of responding to ACLS measures and was  felt to have demonstrated cardiac unresponsiveness. Medical examiner will be contacted regarding whether this should be an ME case. Old records are reviewed and he had a laparoscopic cholecystectomy last August, it was scheduled for an interventional radiology procedure regarding a renal mass, and has had a history of asthma. No other significant past history is identified.  Medical examiner feels that this appears to be a medical death and therefore of not a medical examiner's case. Case has been discussed with Vivi MartensJillian Thomas nurse practitioner, on call for Dr. Everlene OtherBouska. I will sign the death certificate.  Dione Boozeavid Vicki Pasqual, MD September 29, 2013 0330  Dione Boozeavid Aaron Boeh, MD 01/30/14 2252

## 2014-01-14 NOTE — ED Notes (Signed)
Chaplain has been trying to contact family members. Still no family present.

## 2014-01-14 NOTE — Code Documentation (Signed)
King airway removed. Pt. intubated by Dr. Preston FleetingGlick with #[id number].

## 2014-01-23 DEATH — deceased

## 2014-01-30 ENCOUNTER — Encounter (HOSPITAL_COMMUNITY): Admission: RE | Payer: Self-pay | Source: Ambulatory Visit

## 2014-01-30 ENCOUNTER — Ambulatory Visit (HOSPITAL_COMMUNITY)
Admission: RE | Admit: 2014-01-30 | Payer: BC Managed Care – PPO | Source: Ambulatory Visit | Admitting: Interventional Radiology

## 2014-01-30 ENCOUNTER — Ambulatory Visit (HOSPITAL_COMMUNITY): Payer: BC Managed Care – PPO

## 2014-01-30 SURGERY — RADIO FREQUENCY ABLATION
Anesthesia: General | Laterality: Left
